# Patient Record
Sex: Female | Born: 1980
Health system: Southern US, Community
[De-identification: ages and names within clinical notes are randomized; demographics above are authoritative.]

## PROBLEM LIST (undated history)

## (undated) DIAGNOSIS — G43909 Migraine, unspecified, not intractable, without status migrainosus: Secondary | ICD-10-CM

## (undated) DIAGNOSIS — J302 Other seasonal allergic rhinitis: Secondary | ICD-10-CM

## (undated) DIAGNOSIS — N289 Disorder of kidney and ureter, unspecified: Secondary | ICD-10-CM

## (undated) DIAGNOSIS — F419 Anxiety disorder, unspecified: Secondary | ICD-10-CM

## (undated) DIAGNOSIS — D649 Anemia, unspecified: Secondary | ICD-10-CM

## (undated) HISTORY — PX: EYE SURGERY: SHX253

## (undated) HISTORY — DX: Disorder of kidney and ureter, unspecified: N28.9

## (undated) HISTORY — PX: APPENDECTOMY: SHX54

---
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
## 2000-03-29 ENCOUNTER — Emergency Department (HOSPITAL_COMMUNITY): Admission: EM | Admit: 2000-03-29 | Discharge: 2000-03-29 | Payer: Self-pay | Admitting: Emergency Medicine

## 2000-10-15 ENCOUNTER — Emergency Department (HOSPITAL_COMMUNITY): Admission: EM | Admit: 2000-10-15 | Discharge: 2000-10-15 | Payer: Self-pay | Admitting: Emergency Medicine

## 2001-04-28 ENCOUNTER — Emergency Department (HOSPITAL_COMMUNITY): Admission: EM | Admit: 2001-04-28 | Discharge: 2001-04-28 | Payer: Self-pay | Admitting: *Deleted

## 2001-04-30 ENCOUNTER — Emergency Department (HOSPITAL_COMMUNITY): Admission: EM | Admit: 2001-04-30 | Discharge: 2001-04-30 | Payer: Self-pay | Admitting: Emergency Medicine

## 2001-04-30 ENCOUNTER — Encounter: Payer: Self-pay | Admitting: Emergency Medicine

## 2002-10-26 ENCOUNTER — Encounter: Payer: Self-pay | Admitting: Emergency Medicine

## 2002-10-26 ENCOUNTER — Observation Stay (HOSPITAL_COMMUNITY): Admission: EM | Admit: 2002-10-26 | Discharge: 2002-10-27 | Payer: Self-pay | Admitting: Emergency Medicine

## 2002-10-26 ENCOUNTER — Encounter (INDEPENDENT_AMBULATORY_CARE_PROVIDER_SITE_OTHER): Payer: Self-pay | Admitting: *Deleted

## 2003-01-12 ENCOUNTER — Inpatient Hospital Stay (HOSPITAL_COMMUNITY): Admission: AD | Admit: 2003-01-12 | Discharge: 2003-01-13 | Payer: Self-pay | Admitting: Obstetrics & Gynecology

## 2003-01-13 ENCOUNTER — Encounter: Payer: Self-pay | Admitting: Obstetrics & Gynecology

## 2003-02-13 ENCOUNTER — Ambulatory Visit (HOSPITAL_COMMUNITY): Admission: RE | Admit: 2003-02-13 | Discharge: 2003-02-13 | Payer: Self-pay | Admitting: *Deleted

## 2003-05-28 ENCOUNTER — Inpatient Hospital Stay (HOSPITAL_COMMUNITY): Admission: AD | Admit: 2003-05-28 | Discharge: 2003-05-28 | Payer: Self-pay | Admitting: *Deleted

## 2003-06-06 ENCOUNTER — Inpatient Hospital Stay (HOSPITAL_COMMUNITY): Admission: AD | Admit: 2003-06-06 | Discharge: 2003-06-07 | Payer: Self-pay | Admitting: Obstetrics & Gynecology

## 2003-06-19 ENCOUNTER — Inpatient Hospital Stay (HOSPITAL_COMMUNITY): Admission: AD | Admit: 2003-06-19 | Discharge: 2003-06-19 | Payer: Self-pay | Admitting: Obstetrics & Gynecology

## 2003-06-30 ENCOUNTER — Ambulatory Visit (HOSPITAL_COMMUNITY): Admission: RE | Admit: 2003-06-30 | Discharge: 2003-06-30 | Payer: Self-pay | Admitting: *Deleted

## 2003-07-08 ENCOUNTER — Inpatient Hospital Stay (HOSPITAL_COMMUNITY): Admission: AD | Admit: 2003-07-08 | Discharge: 2003-07-10 | Payer: Self-pay | Admitting: Family Medicine

## 2005-11-20 ENCOUNTER — Emergency Department (HOSPITAL_COMMUNITY): Admission: EM | Admit: 2005-11-20 | Discharge: 2005-11-20 | Payer: Self-pay | Admitting: Emergency Medicine

## 2006-03-31 ENCOUNTER — Emergency Department (HOSPITAL_COMMUNITY): Admission: EM | Admit: 2006-03-31 | Discharge: 2006-03-31 | Payer: Self-pay | Admitting: Emergency Medicine

## 2006-04-01 ENCOUNTER — Emergency Department (HOSPITAL_COMMUNITY): Admission: EM | Admit: 2006-04-01 | Discharge: 2006-04-02 | Payer: Self-pay | Admitting: Emergency Medicine

## 2006-08-25 ENCOUNTER — Inpatient Hospital Stay (HOSPITAL_COMMUNITY): Admission: AD | Admit: 2006-08-25 | Discharge: 2006-08-25 | Payer: Self-pay | Admitting: Obstetrics and Gynecology

## 2006-09-06 ENCOUNTER — Inpatient Hospital Stay (HOSPITAL_COMMUNITY): Admission: AD | Admit: 2006-09-06 | Discharge: 2006-09-06 | Payer: Self-pay | Admitting: Obstetrics and Gynecology

## 2006-09-13 ENCOUNTER — Inpatient Hospital Stay (HOSPITAL_COMMUNITY): Admission: RE | Admit: 2006-09-13 | Discharge: 2006-09-14 | Payer: Self-pay | Admitting: Obstetrics and Gynecology

## 2007-05-31 ENCOUNTER — Ambulatory Visit: Payer: Self-pay | Admitting: Internal Medicine

## 2007-05-31 DIAGNOSIS — G43909 Migraine, unspecified, not intractable, without status migrainosus: Secondary | ICD-10-CM | POA: Insufficient documentation

## 2007-05-31 DIAGNOSIS — R519 Headache, unspecified: Secondary | ICD-10-CM | POA: Insufficient documentation

## 2007-05-31 DIAGNOSIS — R51 Headache: Secondary | ICD-10-CM | POA: Insufficient documentation

## 2007-06-27 ENCOUNTER — Ambulatory Visit: Payer: Self-pay | Admitting: Internal Medicine

## 2007-07-05 ENCOUNTER — Ambulatory Visit: Payer: Self-pay | Admitting: Internal Medicine

## 2007-08-12 ENCOUNTER — Ambulatory Visit: Payer: Self-pay | Admitting: Internal Medicine

## 2007-08-12 DIAGNOSIS — H00019 Hordeolum externum unspecified eye, unspecified eyelid: Secondary | ICD-10-CM | POA: Insufficient documentation

## 2007-09-17 ENCOUNTER — Telehealth: Payer: Self-pay | Admitting: Internal Medicine

## 2007-10-16 ENCOUNTER — Telehealth: Payer: Self-pay | Admitting: Internal Medicine

## 2007-12-31 ENCOUNTER — Ambulatory Visit: Payer: Self-pay | Admitting: Internal Medicine

## 2008-06-19 ENCOUNTER — Ambulatory Visit: Payer: Self-pay | Admitting: Internal Medicine

## 2008-06-19 DIAGNOSIS — J069 Acute upper respiratory infection, unspecified: Secondary | ICD-10-CM | POA: Insufficient documentation

## 2008-06-19 LAB — CONVERTED CEMR LAB: Rapid Strep: NEGATIVE

## 2008-07-06 ENCOUNTER — Telehealth: Payer: Self-pay | Admitting: Internal Medicine

## 2009-09-14 ENCOUNTER — Ambulatory Visit: Payer: Self-pay | Admitting: Family Medicine

## 2009-09-14 DIAGNOSIS — J029 Acute pharyngitis, unspecified: Secondary | ICD-10-CM | POA: Insufficient documentation

## 2009-09-15 ENCOUNTER — Encounter: Payer: Self-pay | Admitting: Internal Medicine

## 2009-09-15 ENCOUNTER — Ambulatory Visit: Payer: Self-pay | Admitting: Family Medicine

## 2009-09-16 ENCOUNTER — Telehealth: Payer: Self-pay | Admitting: Family Medicine

## 2010-05-31 NOTE — Letter (Signed)
Summary: Out of Work  Adult nurse at [employer]  [address]   [place], [place] [address]   Phone: [phone]  Fax: [phone]    Sep 14, 2009   Employee:  COLLIN HENDLEY Shanks    To Whom It May Concern:   For Medical reasons, please excuse the above named employee from work for the following dates:  Start:   09-14-09  End:   09-16-09  If you need additional information, please feel free to contact our office.         Sincerely,    Evelena Peat MD

## 2010-05-31 NOTE — Assessment & Plan Note (Signed)
Summary: [name]   Vital Signs:  Patient profile:   30 year old female Temp:     58. degrees F oral BP sitting:   98 / 60  (left arm) Cuff size:   regular  Vitals Entered By: Sid Falcon LPN (Sep 15, 2009 10:12 AM) CC: one day follow-up   History of Present Illness: Here for follow up.  Seen yesterday with sore throat and pos strep. ? of very early R peritonsillar abscess.  Pt is not sure of any fever overnight but still has sig pain, unimproved.  Swallowing OK.  On Augmentin 875 mg two times a day. No nausea or vomiting.   Allergies (verified): No Known Drug Allergies  Past History:  Past Medical History: Last updated: 06/19/2008 Headache, migraine  Review of Systems      See HPI  Physical Exam  General:  Well-developed,well-nourished,in no acute distress; alert,appropriate and cooperative throughout examination Head:  Normocephalic and atraumatic without obvious abnormalities. No apparent alopecia or balding. Ears:  External ear exam shows no significant lesions or deformities.  Otoscopic examination reveals clear canals, tympanic membranes are intact bilaterally without bulging, retraction, inflammation or discharge. Hearing is grossly normal bilaterally. Mouth:  R peritonsilar swelling and erythema.  No exudate. Neck:  supple with tender nodes R > L. Lungs:  Normal respiratory effort, chest expands symmetrically. Lungs are clear to auscultation, no crackles or wheezes.   Impression & Recommendations:  Problem # 1:  SORE THROAT (ICD-462) I am concerned regarding progression of edema and probable peritonsillar abscess.  pt needs to see ENT today for furhter eval.  Cont Augmentin. Her updated medication list for this problem includes:    Amoxicillin-pot Clavulanate 875-125 Mg Tabs (Amoxicillin-pot clavulanate) ..... One by mouth two times a day for 10 days  Orders: ENT Referral (ENT)  Complete Medication List: 1)  Metoprolol Succinate 25 Mg Tb24 (Metoprolol  succinate) .... Take 1 tablet by mouth two times a day 2)  Imitrex 100 Mg Tabs (Sumatriptan succinate) .... As directed 3)  Fluticasone Propionate 50 Mcg/act Susp (Fluticasone propionate) .... 2 sprays each nostril once daily 4)  Amoxicillin-pot Clavulanate 875-125 Mg Tabs (Amoxicillin-pot clavulanate) .... One by mouth two times a day for 10 days

## 2010-05-31 NOTE — Progress Notes (Signed)
Summary: vomiting  Phone Note Call from Patient   Caller: Patient Call For: Birdie Sons MD Summary of Call: Pt is calling to let Dr. Caryl Never know she has been vomiting since having the peritonsillar abscess lanced. [phone] [facility]) Initial call taken by: Lynann Beaver CMA,  Sep 16, 2009 10:06 AM  Follow-up for Phone Call        OK to call in phenergan tabs 25 mg by mouth q4-6 hours as needed nausea and vomitng #10 with no refills. Follow-up by: Evelena Peat MD,  Sep 16, 2009 12:31 PM    New/Updated Medications: PROMETHAZINE HCL 25 MG TABS (PROMETHAZINE HCL) one by mouth q 4-6 hours as needed nausea Prescriptions: PROMETHAZINE HCL 25 MG TABS (PROMETHAZINE HCL) one by mouth q 4-6 hours as needed nausea  #10 x 0   Entered by:   Lynann Beaver CMA   Authorized by:   Evelena Peat MD   Signed by:   Lynann Beaver CMA on 09/16/2009   Method used:   Electronically to        [facility] # [phone]* (retail)       [address]       [place], [place]  [address]       Ph: [phone]       Fax: [phone]   RxID:   [id number]  pt notified.

## 2010-05-31 NOTE — Assessment & Plan Note (Signed)
Summary: ST/FEVER/NJR   Vital Signs:  Patient profile:   30 year old female Temp:     98.8 degrees F oral BP sitting:   130 / 84  (left arm) Cuff size:   regular  Vitals Entered By: Sid Falcon LPN (Sep 14, 2009 11:19 AM) CC: sore throat, fever started last night   History of Present Illness: Acute visit for sore throat which started last night. Asymmetry with right side greater than left. Tylenol with minimal relief. Denies nasal congestion symptoms or cough. Still drinking and some improvement with throat spray. Temperature of 103 last night. No known drug allergies.  Allergies (verified): No Known Drug Allergies  Past History:  Past Medical History: Last updated: 06/19/2008 Headache, migraine  Past Surgical History: Last updated: 05/31/2007 Appendectomy  1999 removal of cyst on eye age 78  Review of Systems      See HPI  Physical Exam  General:  Well-developed,well-nourished,in no acute distress; alert,appropriate and cooperative throughout examination Head:  Normocephalic and atraumatic without obvious abnormalities. No apparent alopecia or balding. Ears:  External ear exam shows no significant lesions or deformities.  Otoscopic examination reveals clear canals, tympanic membranes are intact bilaterally without bulging, retraction, inflammation or discharge. Hearing is grossly normal bilaterally. Nose:  External nasal examination shows no deformity or inflammation. Nasal mucosa are pink and moist without lesions or exudates. Mouth:  patient has posterior pharynx erythema and some exudate noted right peritonsillar region. She has evidence for some very subtle soft palate swelling right side greater than left with some erythematous changes Neck:  supple with tender anterior cervical nodes right greater than left. No posterior cervical adenopathy Lungs:  Normal respiratory effort, chest expands symmetrically. Lungs are clear to auscultation, no crackles or  wheezes. Heart:  Normal rate and regular rhythm. S1 and S2 normal without gallop, murmur, click, rub or other extra sounds. Skin:  no rashes.     Impression & Recommendations:  Problem # 1:  SORE THROAT (ICD-462) Rapid strep positive. my concern is whether she may be evolving very early right peritonsillar abscess. This point difficult to say for sure. Start Augmentin 875 mg b.i.d. and reassess tomorrow. ENT referral if any signs of progression. Orders: Rapid Strep (14782)  Her updated medication list for this problem includes:    Amoxicillin-pot Clavulanate 875-125 Mg Tabs (Amoxicillin-pot clavulanate) ..... One by mouth two times a day for 10 days  Problem # 2:  MIGRAINE HEADACHE (ICD-346.90) pt requesting refills of Imitrex. Her updated medication list for this problem includes:    Metoprolol Succinate 25 Mg Tb24 (Metoprolol succinate) .Marland Kitchen... Take 1 tablet by mouth two times a day    Imitrex 100 Mg Tabs (Sumatriptan succinate) .Marland Kitchen... As directed  Complete Medication List: 1)  Metoprolol Succinate 25 Mg Tb24 (Metoprolol succinate) .... Take 1 tablet by mouth two times a day 2)  Imitrex 100 Mg Tabs (Sumatriptan succinate) .... As directed 3)  Fluticasone Propionate 50 Mcg/act Susp (Fluticasone propionate) .... 2 sprays each nostril once daily 4)  Amoxicillin-pot Clavulanate 875-125 Mg Tabs (Amoxicillin-pot clavulanate) .... One by mouth two times a day for 10 days  Patient Instructions: 1)  drink plenty of fluids. 2)  Continue Tylenol or Advil for symptomatic relief 3)  Schedule followup tomorrow with primary care physician to reassess Prescriptions: IMITREX 100 MG  TABS (SUMATRIPTAN SUCCINATE) as directed  #9 x 3   Entered and Authorized by:   Evelena Peat MD   Signed by:   Evelena Peat MD on  09/14/2009   Method used:   Electronically to        [facility] # [id number]* (retail)       [address]       [place], [place]  [address]       Ph: [phone]       Fax:  [phone]   RxID:   [id number] AMOXICILLIN-POT CLAVULANATE 875-125 MG TABS (AMOXICILLIN-POT CLAVULANATE) one by mouth two times a day for 10 days  #20 x 0   Entered and Authorized by:   Evelena Peat MD   Signed by:   Evelena Peat MD on 09/14/2009   Method used:   Electronically to        [facility] # [phone]* (retail)       [address]       [place], [place]  [address]       Ph: [phone]       Fax: [phone]   RxID:   [id number]

## 2010-05-31 NOTE — Consult Note (Signed)
Summary: [facility]  [facility]   Imported By: Maryln Gottron 09/21/2009 13:50:32  _____________________________________________________________________  External Attachment:    Type:   Image     Comment:   External Document

## 2010-09-16 NOTE — Discharge Summary (Signed)
NAME:  [place], [name]            ACCOUNT NO.:  [id number]   MEDICAL RECORD NO.:  [id number]          PATIENT TYPE:  INP   LOCATION:  9119                          FACILITY:  [facility]   PHYSICIAN:  Huel Cote, M.D. DATE OF BIRTH:  1981/02/03   DATE OF ADMISSION:  09/13/2006  DATE OF DISCHARGE:  09/14/2006                               DISCHARGE SUMMARY   DISCHARGE DIAGNOSES:  1. Term pregnancy at 39 plus weeks delivered.  2. Status post normal spontaneous vaginal delivery.   DISCHARGE MEDICATIONS:  Motrin 600 mg p.r.n. every 6 hours p.r.n.   DISCHARGE FOLLOWUP:  The patient is to follow up office in 6 weeks for  her routine postpartum exam.   HOSPITAL COURSE:  The patient is 30 year old G4, P 2-0-1-2 who was  admitted 35-3/7 weeks for induction of labor.  Prenatal care was  uncomplicated.  Prenatal labs are as follows; A+ antibody negative, RPR  nonreactive, rubella immune, hepatitis B surface antigen negative, HIV  negative, GC negative, chlamydia negative, group B strep positive, 1-  hour Glucola was 59 with a normal 3-hour Glucola. First trimester screen  was normal.   PAST OBSTETRICAL HISTORY:  She had a history of a vaginal delivery of a  66-year-old and a vaginal delivery of a 58-year-old. She had one elective  abortion and no other pregnancies.   PAST MEDICAL HISTORY:  None.   PAST SURGICAL HISTORY:  None.   ALLERGIES:  No known drug allergies.   SOCIAL HISTORY:  She is a smoker approximately a pack a day.   HOSPITAL COURSE:  On admission she was afebrile with stable vital signs.  Fetal heart rate was reactive.  She was placed on Pitocin, her cervical  exam thereafter was 4-5, 90, a 0 station which point she received an  epidural. She was on penicillin for her positive group B strep status.  She progressed rapidly to complete dilation and pushed well, delivered a  vigorous female infant, Apgars were 9 and 9, weight was 7 pounds 5 ounces.  She had left labial right  periurethral lacerations which were repaired  with 3-0 Vicryl for hemostasis. She then was admitted for routine  postpartum care.  Postpartum day #1 she was doing well.  Her hemoglobin  was 10.4.  She was afebrile with stable vital signs and requested early  discharge that evening which was granted. She will follow up in the  office as stated in 6 weeks.     Huel Cote, M.D.  Electronically Signed    KR/MEDQ  D:  10/29/2006  T:  10/29/2006  Job:  [id number]

## 2010-09-16 NOTE — H&P (Signed)
   NAMECOLLEENA, [name] NO.:  [id number]   MEDICAL RECORD NO.:  [id number]                   PATIENT TYPE:  EMS   LOCATION:  MAJO                                 FACILITY:  [facility]   PHYSICIAN:  Gabrielle Dare. Janee Morn, M.D.             DATE OF BIRTH:  04-Jul-1980   DATE OF ADMISSION:  10/26/2002  DATE OF DISCHARGE:                                HISTORY & PHYSICAL   CHIEF COMPLAINT:  Right lower quadrant abdominal pain.   HISTORY OF PRESENT ILLNESS:  The patient is a 30 year old white female who old white female who  developed right lower quadrant abdominal pain late Friday night or early  Saturday morning.  This persisted and got worse and she came to the  emergency room for evaluation.  Work up revealed normal labs but CT scan is  consistent with early acute appendicitis.  She continues to complain of this  pain in her right lower quadrant.  She has not been vomiting but has had  some mild nausea.  No other GI complaints.   PAST MEDICAL HISTORY:  Ovarian cyst.   PAST SURGICAL HISTORY:  None.   MEDICATIONS:  None.   ALLERGIES:  No known drug allergies.   SOCIAL HISTORY:  She does not smoke.  She drinks alcohol occasionally.   REVIEW OF SYSTEMS:  CARDIOVASCULAR: Negative.  PULMONARY: Negative.  GI:  Please refer to the history of present illness.  MUSCULOSKELETAL: Negative.  NEURO/PSYCHE: Negative.  Remainder of review of systems is negative.   PHYSICAL EXAMINATION:  VITAL SIGNS: Temperature is 97.5, blood pressure  127/87, pulse 98, respirations 21.  GENERAL: She is awake, alert, in some mild distress.  HEENT: Pupils are equal, sclerae is non icteric.  NECK: Supple.  LUNGS: Clear to auscultation bilaterally.  HEART: Regular rate and rhythm.  ABDOMEN: Soft, she has point tenderness with voluntary guarding in the right  lower quadrant.  The remainder of the abdomen is soft and nontender with no  masses.  SKIN: Warm and dry with no rashes.   DATA REVIEWED:  Includes a  white blood cell count of 8400.  Urine pregnancy  screen was negative.  CT scan is consistent with early appendicitis.   IMPRESSION:  30 year old healthy female with acute appendicitis with acute appendicitis.    PLAN:  Will take the patient to the operating room for appendectomy.  The  procedure risks and benefits were discussed in detail with the patient and  her mother.  Questions were answered and we will plan to go ahead with the  procedure.                                               Gabrielle Dare Janee Morn, M.D.    BET/MEDQ  D:  10/26/2002  T:  10/26/2002  Job:  [id number]

## 2010-09-16 NOTE — Op Note (Signed)
[name], [name]                      ACCOUNT NO.:  [id number]   MEDICAL RECORD NO.:  [id number]                   PATIENT TYPE:  OBV   LOCATION:  2550                                 FACILITY:  [facility]   PHYSICIAN:  Gabrielle Dare. Janee Morn, M.D.             DATE OF BIRTH:  06-13-1980   DATE OF PROCEDURE:  10/26/2002  DATE OF DISCHARGE:                                 OPERATIVE REPORT   PREOPERATIVE DIAGNOSIS:  Acute appendicitis.   POSTOPERATIVE DIAGNOSES:  1. Acute appendicitis.  2. Inflamed right ovary.   PROCEDURE:  Laparoscopic appendectomy.   SURGEON:  Gabrielle Dare. Janee Morn, M.D.   ANESTHESIA:  General.   HISTORY OF PRESENT ILLNESS:  The patient is a 30 year old female who came to  the emergency department with an over 24-hour complaint of increasing right  lower quadrant pain. Her white blood cell count was normal but her CT scan  demonstrated an early acute appendicitis. She was brought to the operating  room for an appendectomy.   DESCRIPTION OF PROCEDURE:  Informed consent had been obtained. The patient  received intravenous antibiotics. She was brought to the operating room and  underwent general anesthesia. Her abdomen  was prepped and draped in a  sterile fashion.   A curvilinear infraumbilical incision was made. The subcutaneous tissues  were dissected down, the anterior fascia was divided sharply and the  peritoneum was then entered under direct vision without difficulty. Then a 0  Vicryl pursestring suture was placed around the fascial opening and a Hasson  trocar was then inserted into the abdomen, and the abdomen was insufflated  with CO2 in the standard fashion. Under direct vision a 5-mm right upper  quadrant port and a 12-mm left lower abdomen port were placed.   Subsequently exploration of the right lower quadrant demonstrated a mildly  inflamed appendix with some injection and laying on what appeared to be a  somewhat inflamed right ovary. There was a  significant amount of fluid in  the area. Subsequently the appendix was retracted, and a window was  dissected in the mesoappendix next  to the base.   The mesoappendix was then divided with a vascular load of the endoscopic GIA  stapler. This freed it up nicely. Subsequently the base of the appendix was  divided with the tissue  load of the endoscopic GIA stapler. The appendix  was placed in an EndoCatch bag and taken out of the abdomen via the left  lower quadrant port site.   Once this was accomplished the abdomen was copiously irrigated. No other  abnormalities were noted. The abdomen was copiously irrigated with 2 liters  of saline. Excellent hemostasis was noted. The 2 ports were removed under  direct vision. The pneumoperitoneum was released and the Hasson trocar was  removed.   The umbilical fascia was closed by tying the 0 Vicryl pursestring suture.  Subsequently all 3 wounds were copiously irrigated and hemostasis was  obtained with the Bovie cautery and the skin of each was closed with a 4-0  Vicryl subcuticular stitch. All sponge, instrument and needle counts were  all correct. Benzoin, Steri-Strips and a sterile dressings were applied.   The patient tolerated the procedure without apparent complications. She was  taken to the recovery room in stable condition.                                               Gabrielle Dare Janee Morn, M.D.    BET/MEDQ  D:  10/26/2002  T:  10/26/2002  Job:  [id number]

## 2010-09-16 NOTE — Discharge Summary (Signed)
   NAMEKAZZANDRA, [name]                      ACCOUNT NO.:  [id number]   MEDICAL RECORD NO.:  [id number]                   PATIENT TYPE:  OBV   LOCATION:  5733                                 FACILITY:  [facility]   PHYSICIAN:  Gabrielle Dare. Janee Morn, M.D.             DATE OF BIRTH:  1980-11-24   DATE OF ADMISSION:  10/26/2002  DATE OF DISCHARGE:  10/27/2002                                 DISCHARGE SUMMARY   DISCHARGE DIAGNOSES:  1. Acute appendicitis.  2. Status post laparoscopic appendectomy.   HISTORY OF PRESENT ILLNESS:  The patient is a healthy 30 year old white  female who presented with acute onset of right lower quadrant abdominal pain  to the emergency room yesterday.  CT scan demonstrated early acute  appendicitis.  She was admitted and taken to the operating room for  emergency appendectomy.   HOSPITAL COURSE:  The patient underwent a laparoscopic appendectomy which  was uncomplicated.  Intraoperatively, she was also noted to have some  inflammation of her right ovary, which was down in the same area next to her  appendix.  No other abnormalities were noted.  She tolerated the procedure  well postoperatively.  She remained hemodynamically stable and afebrile.  She tolerated gradual advancement of her diet, had good pain control and was  discharged home on postoperative day one in stable condition.   DISCHARGE DIET:  Regular.   DISCHARGE ACTIVITIES:  No lifting.   DISCHARGE MEDICATIONS:  Vicodin 5/500 one to two every 6 hours p.r.n. pain  and follow up with me, Dr. Violeta Gelinas, in three weeks.                                               Gabrielle Dare Janee Morn, M.D.    BET/MEDQ  D:  10/27/2002  T:  10/27/2002  Job:  [id number]

## 2010-11-10 ENCOUNTER — Telehealth: Payer: Self-pay | Admitting: Internal Medicine

## 2010-11-10 MED ORDER — SUMATRIPTAN SUCCINATE 100 MG PO TABS: 100.0000 mg | ORAL_TABLET | Freq: Once | ORAL | Status: DC | PRN

## 2010-11-10 NOTE — Telephone Encounter (Signed)
rx called in

## 2010-11-10 NOTE — Telephone Encounter (Signed)
Pt called 7/12 ands needs a refill on her Imitrex. She used to get it at [facility], but due to an insurance change, she now has to go to [facility] on [address]. She has not yet used them. She was told by a scheduler here that she had to call her pharmacy. Because she has not yet had a Rx filled at [facility], they told her to call us. Please call this Imitrex in as a new Rx to the [facility]. Their ph # is [phone].

## 2011-12-22 ENCOUNTER — Ambulatory Visit (INDEPENDENT_AMBULATORY_CARE_PROVIDER_SITE_OTHER): Payer: Managed Care, Other (non HMO) | Admitting: Family Medicine

## 2011-12-22 ENCOUNTER — Other Ambulatory Visit: Payer: Self-pay | Admitting: Internal Medicine

## 2011-12-22 ENCOUNTER — Encounter: Payer: Self-pay | Admitting: Family Medicine

## 2011-12-22 VITALS — BP 110/80 | Temp 98.9°F | Wt 167.0 lb

## 2011-12-22 DIAGNOSIS — G43909 Migraine, unspecified, not intractable, without status migrainosus: Secondary | ICD-10-CM

## 2011-12-22 DIAGNOSIS — G43901 Migraine, unspecified, not intractable, with status migrainosus: Secondary | ICD-10-CM

## 2011-12-22 DIAGNOSIS — G43809 Other migraine, not intractable, without status migrainosus: Secondary | ICD-10-CM

## 2011-12-22 MED ORDER — SUMATRIPTAN SUCCINATE 100 MG PO TABS: 100.0000 mg | ORAL_TABLET | Freq: Once | ORAL | Status: DC | PRN

## 2011-12-22 MED ORDER — HYDROCODONE-ACETAMINOPHEN 5-325 MG PO TABS: ORAL_TABLET | ORAL | Status: DC

## 2011-12-22 MED ORDER — PREDNISONE 10 MG PO TABS: ORAL_TABLET | ORAL | Status: DC

## 2011-12-22 MED ORDER — PROMETHAZINE HCL 25 MG PO TABS: 25.0000 mg | ORAL_TABLET | Freq: Three times a day (TID) | ORAL | Status: DC | PRN

## 2011-12-22 MED ORDER — TOPIRAMATE 25 MG PO TABS: ORAL_TABLET | ORAL | Status: DC

## 2011-12-22 NOTE — Progress Notes (Signed)
  Subjective:    Patient ID: [name], female    DOB: [birth year], 31 y.o.   MRN: [id number]  HPI  Acute visit. Patient seen with prolonged headache for 5 days. Typical migraines in terms of quality but atypical duration. She's had a tendency to have migraines lasting about 3 days previously. Her headache is mostly right frontal to parietal. She's had some nausea and occasional vomiting. Photophobia. Difficulty sleeping past few days. She usually takes Imitrex but ran out. Denies fever. No focal neurologic symptoms. Took aspirin and Excedrin Migraine without any improvement. No alleviating factors.  No associated fever, nasal discharge.     Review of Systems  Constitutional: Negative for fever and chills.  HENT: Negative for neck stiffness.   Gastrointestinal: Positive for nausea and vomiting.  Neurological: Positive for headaches. Negative for dizziness, syncope and weakness.  Hematological: Negative for adenopathy.  Psychiatric/Behavioral: Negative for confusion.       Objective:   Physical Exam  Constitutional: She is oriented to person, place, and time. She appears well-developed and well-nourished.  HENT:  Right Ear: External ear normal.  Left Ear: External ear normal.  Eyes: Pupils are equal, round, and reactive to light.  Neck: Neck supple.  Cardiovascular: Normal rate and regular rhythm.   Pulmonary/Chest: Effort normal and breath sounds normal. No respiratory distress. She has no wheezes. She has no rales.  Neurological: She is alert and oriented to person, place, and time. No cranial nerve deficit.  Psychiatric: She has a normal mood and affect. Her behavior is normal.          Assessment & Plan:  Status migraine. Prednisone taper. Limited hydrocodone for severe pain. Phenergan for nausea. May need admission for IV medications if not resolving with above.  Discussed possible prophylaxis. Previously tried metoprolol without success. Start low-dose Topamax and  titrate and followup with primary in one month

## 2011-12-22 NOTE — Patient Instructions (Signed)

## 2012-01-22 ENCOUNTER — Ambulatory Visit (INDEPENDENT_AMBULATORY_CARE_PROVIDER_SITE_OTHER): Payer: Managed Care, Other (non HMO) | Admitting: Family Medicine

## 2012-01-22 DIAGNOSIS — G43909 Migraine, unspecified, not intractable, without status migrainosus: Secondary | ICD-10-CM

## 2012-01-22 MED ORDER — TOPIRAMATE 25 MG PO TABS: ORAL_TABLET | ORAL | Status: DC

## 2012-01-22 NOTE — Patient Instructions (Addendum)

## 2012-01-23 NOTE — Progress Notes (Signed)
  Subjective:    Patient ID: [name], female    DOB: 08/05/1980, 31 y.o.   MRN: [id number]  HPI  Followup for headaches. Suspected migraines. Had recent status migraine type headache which eventually improved with prednisone. She's had increased frequency and severity of headaches over the past year. Previous intolerance to beta blockers. Patient on birth control. After much discussion, we decided on trial of Topamax. She's had great response. She has had 3 milder migraines since then but overall decreasing frequency and severity. No change in pattern headaches otherwise. She is overall very pleased with response to Topamax. No focal neurologic symptoms   Review of Systems  Constitutional: Negative for fever, appetite change, fatigue and unexpected weight change.  Eyes: Negative for visual disturbance.  Neurological: Positive for headaches. Negative for dizziness, seizures and syncope.  Psychiatric/Behavioral: Negative for confusion.       Objective:   Physical Exam  Constitutional: She is oriented to person, place, and time. She appears well-developed and well-nourished. No distress.  Eyes: Pupils are equal, round, and reactive to light.       Fundi benign  Neck: Neck supple. No thyromegaly present.  Cardiovascular: Normal rate and regular rhythm.   Pulmonary/Chest: Effort normal and breath sounds normal. No respiratory distress. She has no wheezes. She has no rales.  Lymphadenopathy:    She has no cervical adenopathy.  Neurological: She is alert and oriented to person, place, and time. No cranial nerve deficit.          Assessment & Plan:  Migraine headaches. Improving on Topamax. Currently takes 25 mg 2 tablets daily. Titrate to 25 mg in the morning and 50 mg at night. After 2 weeks if no further improvements titrate further to 50 mg twice daily.  She knows that she must maintain regular birth control on this medication

## 2012-02-09 ENCOUNTER — Ambulatory Visit: Payer: Managed Care, Other (non HMO) | Admitting: Internal Medicine

## 2012-03-23 ENCOUNTER — Encounter (HOSPITAL_COMMUNITY): Payer: Self-pay | Admitting: Emergency Medicine

## 2012-03-23 ENCOUNTER — Emergency Department (HOSPITAL_COMMUNITY)
Admission: EM | Admit: 2012-03-23 | Discharge: 2012-03-23 | Disposition: A | Discharge status: Home / Self Care | Payer: Managed Care, Other (non HMO) | Attending: Emergency Medicine | Admitting: Emergency Medicine

## 2012-03-23 DIAGNOSIS — Z9889 Other specified postprocedural states: Secondary | ICD-10-CM

## 2012-03-23 DIAGNOSIS — J029 Acute pharyngitis, unspecified: Secondary | ICD-10-CM

## 2012-03-23 DIAGNOSIS — Z862 Personal history of diseases of the blood and blood-forming organs and certain disorders involving the immune mechanism: Secondary | ICD-10-CM | POA: Insufficient documentation

## 2012-03-23 DIAGNOSIS — R509 Fever, unspecified: Secondary | ICD-10-CM | POA: Insufficient documentation

## 2012-03-23 DIAGNOSIS — Z8639 Personal history of other endocrine, nutritional and metabolic disease: Secondary | ICD-10-CM | POA: Insufficient documentation

## 2012-03-23 DIAGNOSIS — Z8669 Personal history of other diseases of the nervous system and sense organs: Secondary | ICD-10-CM | POA: Insufficient documentation

## 2012-03-23 DIAGNOSIS — F17200 Nicotine dependence, unspecified, uncomplicated: Secondary | ICD-10-CM | POA: Insufficient documentation

## 2012-03-23 DIAGNOSIS — G43109 Migraine with aura, not intractable, without status migrainosus: Secondary | ICD-10-CM | POA: Insufficient documentation

## 2012-03-23 DIAGNOSIS — H60399 Other infective otitis externa, unspecified ear: Secondary | ICD-10-CM | POA: Insufficient documentation

## 2012-03-23 HISTORY — DX: Migraine, unspecified, not intractable, without status migrainosus: G43.909

## 2012-03-23 MED ORDER — CLINDAMYCIN HCL 300 MG PO CAPS: 300.0000 mg | ORAL_CAPSULE | Freq: Four times a day (QID) | ORAL | Status: DC

## 2012-03-23 MED ORDER — PREDNISONE 10 MG PO TABS: 20.0000 mg | ORAL_TABLET | Freq: Two times a day (BID) | ORAL | Status: DC

## 2012-03-23 NOTE — ED Notes (Signed)
Pt sts hx of tonsillar abscess and sts feels same on right side with pain and fever; pt sts taking antibiotics x 3 days for ear infection

## 2012-03-23 NOTE — ED Provider Notes (Signed)
History   This chart was scribed for Geoffery Lyons, MD, by Frederik Pear, ER scribe. The patient was seen in room TR09C/TR09C and the patient's care was started at 1308.    CSN: [id number]  Arrival date & time 03/23/12  1258   First MD Initiated Contact with Patient 03/23/12 1308      Chief Complaint  Patient presents with  . Abscess  . Sore Throat    (Consider location/radiation/quality/duration/timing/severity/associated sxs/prior treatment) HPI Comments: [name] is a 31 y.o. female with a h/o of tonsillar abscesses who presents to the Emergency Department complaining of a right-sided tonsillar pain that began at 2 am. She reports a fever at home last night that she has been treating with Excedrin that provided moderate relief. In ED, her temperature is 99. She also reports a left-sided ear infection that she has been taking doxycyline for 3 days. She reports that her son has been ill with a cold this week.          Past Medical History  Diagnosis Date  . Migraine     History reviewed. No pertinent past surgical history.  History reviewed. No pertinent family history.  History  Substance Use Topics  . Smoking status: Current Every Day Smoker  . Smokeless tobacco: Not on file  . Alcohol Use: Yes     Comment: occ    OB History    Grav Para Term Preterm Abortions TAB SAB Ect Mult Living                  Review of Systems A complete 10 system review of systems was obtained and all systems are negative except as noted in the HPI and PMH.   Allergies  Review of patient's allergies indicates no known allergies.  Home Medications   Current Outpatient Rx  Name  Route  Sig  Dispense  Refill  . HYDROCODONE-ACETAMINOPHEN 5-325 MG PO TABS      One to 2 tablets every 4-6 hours as needed for severe headache   20 tablet   0   . MICROGESTIN 1/20 1-20 MG-MCG PO TABS   Oral   Take 1 tablet by mouth daily. Per OB/GYN         . PREDNISONE 10 MG PO TABS     Taper as follows: 4-4-4-3-3-2-1   21 tablet   0   . PROMETHAZINE HCL 25 MG PO TABS   Oral   Take 1 tablet (25 mg total) by mouth every 8 (eight) hours as needed for nausea.   15 tablet   0   . SUMATRIPTAN SUCCINATE 100 MG PO TABS   Oral   Take 1 tablet (100 mg total) by mouth once as needed for migraine.   9 tablet   6   . TOPIRAMATE 25 MG PO TABS      Take one tablet each morning and two tablets at night.   90 tablet   6     BP 126/80  Pulse 108  Temp 99 F (37.2 C) (Oral)  Resp 16  SpO2 100%  Physical Exam  Nursing note and vitals reviewed. Constitutional: She is oriented to person, place, and time. She appears well-developed and well-nourished. No distress.  HENT:  Head: Normocephalic and atraumatic.       There is mild erythema to her posterior oropharynx.  There are no exudates. Her tonsils appear symmetrical.   Eyes: EOM are normal. Pupils are equal, round, and reactive to light.  Neck: Normal  range of motion. Neck supple. No tracheal deviation present.  Cardiovascular: Normal rate.   Pulmonary/Chest: Effort normal. No respiratory distress.  Abdominal: Soft. She exhibits no distension.  Musculoskeletal: Normal range of motion. She exhibits no edema.  Neurological: She is alert and oriented to person, place, and time.  Skin: Skin is warm and dry.  Psychiatric: She has a normal mood and affect. Her behavior is normal.    ED Course  Procedures (including critical care time)  DIAGNOSTIC STUDIES: Oxygen Saturation is 100% on room air, normal by my interpretation.    COORDINATION OF CARE:  13:20- Discussed planned course of treatment with the patient, including a prescription of prednisone and clindamycin,  who is agreeable at this time.    Labs Reviewed - No data to display No results found.   No diagnosis found.    MDM  No evidence of a peritonsillar abscess on exam.  She is afebrile and there is no stridor.  Will treat with clindamycin and  prednisone.  She is to return if worsening.       I personally performed the services described in this documentation, which was scribed in my presence. The recorded information has been reviewed and is accurate.         Geoffery Lyons, MD 03/23/12 [phone]

## 2012-05-01 NOTE — L&D Delivery Note (Signed)
Delivery Note At 10:10 PM a healthy female was delivered via Vaginal, Spontaneous Delivery (Presentation: Left Occiput Anterior).  APGAR: 9, 9; weight pending.   Placenta status: Intact, Spontaneous.  Cord:  with the following complications: None.    Anesthesia: Epidural  Episiotomy: None Lacerations: small periurethral Suture Repair: 3.0 vicryl rapide Est. Blood Loss (mL): 350cc  Mom to postpartum.  Baby to stay with mom  [name] 12/19/2012, 10:30 PM

## 2012-08-08 ENCOUNTER — Other Ambulatory Visit (HOSPITAL_COMMUNITY): Payer: Self-pay | Admitting: Obstetrics and Gynecology

## 2012-08-08 DIAGNOSIS — Z3689 Encounter for other specified antenatal screening: Secondary | ICD-10-CM

## 2012-08-12 ENCOUNTER — Encounter (HOSPITAL_COMMUNITY): Payer: Self-pay

## 2012-08-12 ENCOUNTER — Ambulatory Visit (HOSPITAL_COMMUNITY)
Admission: RE | Admit: 2012-08-12 | Discharge: 2012-08-12 | Disposition: A | Discharge status: Home / Self Care | Payer: Managed Care, Other (non HMO) | Source: Ambulatory Visit | Attending: Obstetrics and Gynecology | Admitting: Obstetrics and Gynecology

## 2012-08-12 DIAGNOSIS — O358XX0 Maternal care for other (suspected) fetal abnormality and damage, not applicable or unspecified: Secondary | ICD-10-CM | POA: Insufficient documentation

## 2012-08-12 DIAGNOSIS — Z1389 Encounter for screening for other disorder: Secondary | ICD-10-CM | POA: Insufficient documentation

## 2012-08-12 DIAGNOSIS — Z3689 Encounter for other specified antenatal screening: Secondary | ICD-10-CM

## 2012-08-12 DIAGNOSIS — Z363 Encounter for antenatal screening for malformations: Secondary | ICD-10-CM | POA: Insufficient documentation

## 2012-08-19 LAB — OB RESULTS CONSOLE ABO/RH: RH Type: POSITIVE

## 2012-08-19 LAB — OB RESULTS CONSOLE GC/CHLAMYDIA
Chlamydia: NEGATIVE
Gonorrhea: NEGATIVE

## 2012-12-19 ENCOUNTER — Encounter (HOSPITAL_COMMUNITY): Payer: Self-pay | Admitting: *Deleted

## 2012-12-19 ENCOUNTER — Inpatient Hospital Stay (HOSPITAL_COMMUNITY)
Admission: AD | Admit: 2012-12-19 | Discharge: 2012-12-21 | DRG: 775 | Disposition: A | Discharge status: Home / Self Care | Payer: Managed Care, Other (non HMO) | Source: Ambulatory Visit | Attending: Obstetrics and Gynecology | Admitting: Obstetrics and Gynecology

## 2012-12-19 ENCOUNTER — Inpatient Hospital Stay (HOSPITAL_COMMUNITY): Payer: Managed Care, Other (non HMO) | Admitting: Anesthesiology

## 2012-12-19 ENCOUNTER — Encounter (HOSPITAL_COMMUNITY): Payer: Self-pay | Admitting: Anesthesiology

## 2012-12-19 DIAGNOSIS — O99334 Smoking (tobacco) complicating childbirth: Secondary | ICD-10-CM | POA: Diagnosis present

## 2012-12-19 LAB — CBC
HCT: 33.4 % — ABNORMAL LOW (ref 36.0–46.0)
Hemoglobin: 11.9 g/dL — ABNORMAL LOW (ref 12.0–15.0)
MCH: 30.1 pg (ref 26.0–34.0)
MCV: 84.3 fL (ref 78.0–100.0)
Platelets: 175 10*3/uL (ref 150–400)
RBC: 3.96 MIL/uL (ref 3.87–5.11)
WBC: 9.8 10*3/uL (ref 4.0–10.5)

## 2012-12-19 MED ORDER — PHENYLEPHRINE 40 MCG/ML (10ML) SYRINGE FOR IV PUSH (FOR BLOOD PRESSURE SUPPORT)
80.0000 ug | PREFILLED_SYRINGE | INTRAVENOUS | Status: DC | PRN
  Filled 2012-12-19: qty 5
  Filled 2012-12-19: qty 2

## 2012-12-19 MED ORDER — CITRIC ACID-SODIUM CITRATE 334-500 MG/5ML PO SOLN
30.0000 mL | ORAL | Status: DC | PRN
  Administered 2012-12-19: 30 mL via ORAL
  Filled 2012-12-19: qty 15

## 2012-12-19 MED ORDER — SODIUM BICARBONATE 8.4 % IV SOLN
INTRAVENOUS | Status: DC | PRN
  Administered 2012-12-19: 5 mL via EPIDURAL

## 2012-12-19 MED ORDER — OXYTOCIN BOLUS FROM INFUSION: 500.0000 mL | INTRAVENOUS | Status: DC

## 2012-12-19 MED ORDER — PHENYLEPHRINE 40 MCG/ML (10ML) SYRINGE FOR IV PUSH (FOR BLOOD PRESSURE SUPPORT)
80.0000 ug | PREFILLED_SYRINGE | INTRAVENOUS | Status: DC | PRN
Start: 2012-12-19 — End: 2012-12-20
  Filled 2012-12-19: qty 2

## 2012-12-19 MED ORDER — TERBUTALINE SULFATE 1 MG/ML IJ SOLN: 0.2500 mg | Freq: Once | INTRAMUSCULAR | Status: AC | PRN

## 2012-12-19 MED ORDER — LIDOCAINE HCL (PF) 1 % IJ SOLN
30.0000 mL | INTRAMUSCULAR | Status: DC | PRN
  Filled 2012-12-19 (×2): qty 30

## 2012-12-19 MED ORDER — BUTORPHANOL TARTRATE 1 MG/ML IJ SOLN: 1.0000 mg | INTRAMUSCULAR | Status: DC | PRN

## 2012-12-19 MED ORDER — FENTANYL 2.5 MCG/ML BUPIVACAINE 1/10 % EPIDURAL INFUSION (WH - ANES)
14.0000 mL/h | INTRAMUSCULAR | Status: DC | PRN
  Administered 2012-12-19: 14 mL/h via EPIDURAL
  Filled 2012-12-19: qty 125

## 2012-12-19 MED ORDER — LACTATED RINGERS IV SOLN: 500.0000 mL | INTRAVENOUS | Status: DC | PRN

## 2012-12-19 MED ORDER — LACTATED RINGERS IV SOLN
500.0000 mL | Freq: Once | INTRAVENOUS | Status: AC
  Administered 2012-12-19: 500 mL via INTRAVENOUS

## 2012-12-19 MED ORDER — LACTATED RINGERS IV SOLN: INTRAVENOUS | Status: DC

## 2012-12-19 MED ORDER — OXYCODONE-ACETAMINOPHEN 5-325 MG PO TABS
1.0000 | ORAL_TABLET | ORAL | Status: DC | PRN
Start: 2012-12-19 — End: 2012-12-20

## 2012-12-19 MED ORDER — EPHEDRINE 5 MG/ML INJ
10.0000 mg | INTRAVENOUS | Status: DC | PRN
  Filled 2012-12-19: qty 2

## 2012-12-19 MED ORDER — ONDANSETRON HCL 4 MG/2ML IJ SOLN: 4.0000 mg | Freq: Four times a day (QID) | INTRAMUSCULAR | Status: DC | PRN

## 2012-12-19 MED ORDER — EPHEDRINE 5 MG/ML INJ
10.0000 mg | INTRAVENOUS | Status: DC | PRN
Start: 2012-12-19 — End: 2012-12-20
  Filled 2012-12-19: qty 4
  Filled 2012-12-19: qty 2

## 2012-12-19 MED ORDER — DIPHENHYDRAMINE HCL 50 MG/ML IJ SOLN: 12.5000 mg | INTRAMUSCULAR | Status: DC | PRN

## 2012-12-19 MED ORDER — OXYTOCIN 40 UNITS IN LACTATED RINGERS INFUSION - SIMPLE MED
1.0000 m[IU]/min | INTRAVENOUS | Status: DC
  Administered 2012-12-19: 2 m[IU]/min via INTRAVENOUS

## 2012-12-19 MED ORDER — OXYTOCIN 40 UNITS IN LACTATED RINGERS INFUSION - SIMPLE MED
62.5000 mL/h | INTRAVENOUS | Status: DC
  Administered 2012-12-19: 62.5 mL/h via INTRAVENOUS
  Filled 2012-12-19: qty 1000

## 2012-12-19 MED ORDER — ACETAMINOPHEN 325 MG PO TABS: 650.0000 mg | ORAL_TABLET | ORAL | Status: DC | PRN

## 2012-12-19 MED ORDER — IBUPROFEN 600 MG PO TABS: 600.0000 mg | ORAL_TABLET | Freq: Four times a day (QID) | ORAL | Status: DC | PRN

## 2012-12-19 NOTE — H&P (Signed)
[name] is a 32 y.o. female G5P3013 at 37+ weeks (EDD 01/04/13 by LMP c/w 9 week [place]) presenting for ROM with slow steady LOF since 630 last pm.  Irregular contractions.  ROM confirmed with a pool and +nitrazine test in office.  Prenatal care uncomplicated  Maternal Medical History:  Reason for admission: Rupture of membranes.   Contractions: Onset was 6-12 hours ago.   Frequency: irregular.   Perceived severity is mild.    Fetal activity: Perceived fetal activity is normal.    Prenatal Complications - Diabetes: none.    Past Ob Hx 1998 SAB 2000 NSVD 5#15oz 2005 NSVD 6#8oz 2008 NSVD 735oz  Past Medical History  Diagnosis Date  . Migraine    No past surgical history on file. Family History: family history is not on file. Social History:  reports that she has been smoking.  She does not have any smokeless tobacco history on file. She reports that  drinks alcohol. She reports that she does not use illicit drugs.   Prenatal Transfer Tool  Maternal Diabetes: No Genetic Screening: Normal Maternal Ultrasounds/Referrals: Normal Fetal Ultrasounds or other Referrals:  None Maternal Substance Abuse:  No Significant Maternal Medications:  None Significant Maternal Lab Results:  None Other Comments:  None  ROS  Dilation: 2.5 Effacement (%): 50 Station: -2 Exam by:: Elize Pinon Clear fluid, no forebag felt  Blood pressure 119/59, pulse 110, temperature 98.5 F (36.9 C), resp. rate 18, height 5\' 4"  (1.626 m), weight 79.833 kg (176 lb). Maternal Exam:  Uterine Assessment: Contraction strength is mild.  Contraction frequency is irregular.   Abdomen: Patient reports no abdominal tenderness. Fetal presentation: vertex  Introitus: Normal vulva. Normal vagina.  Nitrazine test: positive. Amniotic fluid character: clear.  Pelvis: adequate for delivery.      Physical Exam  Constitutional: She is oriented to person, place, and time. She appears well-developed and  well-nourished.  Cardiovascular: Normal rate and regular rhythm.   Respiratory: Effort normal and breath sounds normal.  GI: Soft.  Genitourinary: Vagina normal and uterus normal.  Neurological: She is alert and oriented to person, place, and time.  Psychiatric: She has a normal mood and affect.    Prenatal labs: ABO, Rh:  A positive Antibody:  negative Rubella:  Immune RPR:   NR HBsAg:   Neg HIV:   NR GBS:   Neg One hour GTT 123 GBS negative  GC negative Chlam negative  Assessment/Plan: Pt admitted for SROM and augmentation of labor.  GBS negative.  Epidural prn.   Oliver Pila 12/19/2012, 2:06 PM

## 2012-12-19 NOTE — Progress Notes (Signed)
Patient ID: [name], female   DOB: 30-Sep-1980, 31 y.o.   MRN: [id number] Pt received epidural and feels better FHR reassuring 90/4-5/-1 per RN Doing well will follow progress.

## 2012-12-19 NOTE — Anesthesia Procedure Notes (Signed)

## 2012-12-19 NOTE — Anesthesia Preprocedure Evaluation (Signed)

## 2012-12-19 NOTE — Progress Notes (Signed)
   Subjective: Pt just now getting uncomfortable  Objective: BP 117/80  Pulse 82  Temp(Src) 98.3 F (36.8 C)  Resp 16  Ht 5\' 4"  (1.626 m)  Wt 79.833 kg (176 lb)  BMI 30.2 kg/m2      FHT: Category 1 UC:  q 3-4 SVE:   3/60/-1 Labs: Lab Results  Component Value Date   WBC 9.8 12/19/2012   HGB 11.9* 12/19/2012   HCT 33.4* 12/19/2012   MCV 84.3 12/19/2012   PLT 175 12/19/2012    Assessment / Plan: Continue pitocin Lianette Broussard W 12/19/2012, 7:00 PM

## 2012-12-20 LAB — CBC
Hemoglobin: 10.7 g/dL — ABNORMAL LOW (ref 12.0–15.0)
MCH: 30 pg (ref 26.0–34.0)
Platelets: 188 10*3/uL (ref 150–400)
RBC: 3.57 MIL/uL — ABNORMAL LOW (ref 3.87–5.11)
WBC: 12.2 10*3/uL — ABNORMAL HIGH (ref 4.0–10.5)

## 2012-12-20 MED ORDER — BENZOCAINE-MENTHOL 20-0.5 % EX AERO
1.0000 "application " | INHALATION_SPRAY | CUTANEOUS | Status: DC | PRN
  Administered 2012-12-20: 1 via TOPICAL
  Filled 2012-12-20: qty 56

## 2012-12-20 MED ORDER — PRENATAL MULTIVITAMIN CH
1.0000 | ORAL_TABLET | Freq: Every day | ORAL | Status: DC
  Administered 2012-12-20: 1 via ORAL
  Filled 2012-12-20: qty 1

## 2012-12-20 MED ORDER — SENNOSIDES-DOCUSATE SODIUM 8.6-50 MG PO TABS: 2.0000 | ORAL_TABLET | Freq: Every day | ORAL | Status: DC

## 2012-12-20 MED ORDER — SIMETHICONE 80 MG PO CHEW: 80.0000 mg | CHEWABLE_TABLET | ORAL | Status: DC | PRN

## 2012-12-20 MED ORDER — ONDANSETRON HCL 4 MG PO TABS: 4.0000 mg | ORAL_TABLET | ORAL | Status: DC | PRN

## 2012-12-20 MED ORDER — PNEUMOCOCCAL VAC POLYVALENT 25 MCG/0.5ML IJ INJ
0.5000 mL | INJECTION | INTRAMUSCULAR | Status: AC
  Administered 2012-12-21: 0.5 mL via INTRAMUSCULAR
  Filled 2012-12-20: qty 0.5

## 2012-12-20 MED ORDER — OXYCODONE-ACETAMINOPHEN 5-325 MG PO TABS: 1.0000 | ORAL_TABLET | ORAL | Status: DC | PRN

## 2012-12-20 MED ORDER — ONDANSETRON HCL 4 MG/2ML IJ SOLN: 4.0000 mg | INTRAMUSCULAR | Status: DC | PRN

## 2012-12-20 MED ORDER — LANOLIN HYDROUS EX OINT: TOPICAL_OINTMENT | CUTANEOUS | Status: DC | PRN

## 2012-12-20 MED ORDER — ZOLPIDEM TARTRATE 5 MG PO TABS: 5.0000 mg | ORAL_TABLET | Freq: Every evening | ORAL | Status: DC | PRN

## 2012-12-20 MED ORDER — WITCH HAZEL-GLYCERIN EX PADS: 1.0000 "application " | MEDICATED_PAD | CUTANEOUS | Status: DC | PRN

## 2012-12-20 MED ORDER — ALUM & MAG HYDROXIDE-SIMETH 200-200-20 MG/5ML PO SUSP
30.0000 mL | Freq: Four times a day (QID) | ORAL | Status: DC | PRN
  Administered 2012-12-20: 30 mL via ORAL
  Filled 2012-12-20 (×2): qty 30

## 2012-12-20 MED ORDER — IBUPROFEN 600 MG PO TABS
600.0000 mg | ORAL_TABLET | Freq: Four times a day (QID) | ORAL | Status: DC
Start: 2012-12-20 — End: 2012-12-21
  Administered 2012-12-20 – 2012-12-21 (×6): 600 mg via ORAL
  Filled 2012-12-20 (×6): qty 1

## 2012-12-20 MED ORDER — TETANUS-DIPHTH-ACELL PERTUSSIS 5-2.5-18.5 LF-MCG/0.5 IM SUSP: 0.5000 mL | Freq: Once | INTRAMUSCULAR | Status: DC

## 2012-12-20 MED ORDER — DIPHENHYDRAMINE HCL 25 MG PO CAPS: 25.0000 mg | ORAL_CAPSULE | Freq: Four times a day (QID) | ORAL | Status: DC | PRN

## 2012-12-20 MED ORDER — DIBUCAINE 1 % RE OINT: 1.0000 "application " | TOPICAL_OINTMENT | RECTAL | Status: DC | PRN

## 2012-12-20 NOTE — Progress Notes (Signed)
Post Partum Day 1 Subjective: no complaints and tolerating PO  Objective: Blood pressure 113/73, pulse 74, temperature 98.6 F (37 C), temperature source Oral, resp. rate 18, height 5\' 4"  (1.626 m), weight 79.833 kg (176 lb), SpO2 99.00%, unknown if currently breastfeeding.  Physical Exam:  General: alert and cooperative Lochia: appropriate Uterine Fundus: firm    Recent Labs  12/19/12 1340 12/20/12 0555  HGB 11.9* 10.7*  HCT 33.4* 30.8*    Assessment/Plan: Plan for discharge tomorrow   LOS: 1 day   Abas Leicht W 12/20/2012, 9:41 AM

## 2012-12-20 NOTE — Anesthesia Postprocedure Evaluation (Signed)
  Anesthesia Post-op Note  Patient: [name]  Procedure(s) Performed: * No procedures listed *  Patient Location: PACU and Mother/Baby  Anesthesia Type:Epidural  Level of Consciousness: awake, alert , oriented and patient cooperative  Airway and Oxygen Therapy: Patient Spontanous Breathing  Post-op Pain: none  Post-op Assessment: Post-op Vital signs reviewed, Patient's Cardiovascular Status Stable and Respiratory Function Stable  Post-op Vital Signs: Reviewed and stable  Complications: No apparent anesthesia complications

## 2012-12-21 NOTE — Discharge Summary (Signed)
Obstetric Discharge Summary Reason for Admission: rupture of membranes Prenatal Procedures: none Intrapartum Procedures: spontaneous vaginal delivery Postpartum Procedures: none Complications-Operative and Postpartum: small periurethral laceration Hemoglobin  Date Value Range Status  12/20/2012 10.7* 12.0 - 15.0 g/dL Final     HCT  Date Value Range Status  12/20/2012 30.8* 36.0 - 46.0 % Final    Physical Exam:  General: alert Lochia: appropriate Uterine Fundus: firm  Discharge Diagnoses: Term Pregnancy-delivered  Discharge Information: Date: 12/21/2012 Activity: pelvic rest Diet: routine Medications: Ibuprofen Condition: stable Instructions: refer to practice specific booklet Discharge to: home Follow-up Information   Follow up with Oliver Pila, MD In 6 weeks. (postpartum and mirena)    Specialty:  Obstetrics and Gynecology   Contact information:   [address], [address] [place] [address] [phone]       Newborn Data: Live born female  Birth Weight: 5 lb 4.8 oz (2404 g) APGAR: 9, 9  Home with mother.  [name] D 12/21/2012, 9:52 AM

## 2012-12-21 NOTE — Progress Notes (Signed)
PPD #2 No problems Afeb, VSS D/c home 

## 2012-12-22 ENCOUNTER — Encounter (HOSPITAL_COMMUNITY): Payer: Self-pay | Admitting: Anesthesiology

## 2012-12-22 ENCOUNTER — Inpatient Hospital Stay (HOSPITAL_COMMUNITY)
Admission: AD | Admit: 2012-12-22 | Discharge: 2012-12-26 | DRG: 776 | Disposition: A | Discharge status: Home / Self Care | Payer: Managed Care, Other (non HMO) | Source: Ambulatory Visit | Attending: Obstetrics and Gynecology | Admitting: Obstetrics and Gynecology

## 2012-12-22 ENCOUNTER — Encounter (HOSPITAL_COMMUNITY): Payer: Self-pay | Admitting: Family

## 2012-12-22 DIAGNOSIS — O8612 Endometritis following delivery: Principal | ICD-10-CM | POA: Diagnosis present

## 2012-12-22 LAB — CBC WITH DIFFERENTIAL/PLATELET
Basophils Absolute: 0 10*3/uL (ref 0.0–0.1)
HCT: 33.1 % — ABNORMAL LOW (ref 36.0–46.0)
Hemoglobin: 11.5 g/dL — ABNORMAL LOW (ref 12.0–15.0)
Lymphocytes Relative: 5 % — ABNORMAL LOW (ref 12–46)
Lymphs Abs: 0.9 10*3/uL (ref 0.7–4.0)
Monocytes Absolute: 0.8 10*3/uL (ref 0.1–1.0)
Neutro Abs: 14.8 10*3/uL — ABNORMAL HIGH (ref 1.7–7.7)
RBC: 3.89 MIL/uL (ref 3.87–5.11)
RDW: 12.9 % (ref 11.5–15.5)
WBC: 16.6 10*3/uL — ABNORMAL HIGH (ref 4.0–10.5)

## 2012-12-22 MED ORDER — OXYCODONE-ACETAMINOPHEN 5-325 MG PO TABS
1.0000 | ORAL_TABLET | ORAL | Status: DC | PRN
  Filled 2012-12-22: qty 1

## 2012-12-22 MED ORDER — IBUPROFEN 600 MG PO TABS
600.0000 mg | ORAL_TABLET | Freq: Four times a day (QID) | ORAL | Status: DC | PRN
  Administered 2012-12-22 – 2012-12-26 (×6): 600 mg via ORAL
  Filled 2012-12-22 (×6): qty 1

## 2012-12-22 MED ORDER — LACTATED RINGERS IV SOLN
INTRAVENOUS | Status: DC
  Administered 2012-12-22 – 2012-12-24 (×5): via INTRAVENOUS

## 2012-12-22 MED ORDER — HYDROMORPHONE HCL PF 1 MG/ML IJ SOLN
1.0000 mg | INTRAMUSCULAR | Status: DC | PRN
  Administered 2012-12-22 – 2012-12-23 (×2): 1 mg via INTRAVENOUS
  Filled 2012-12-22 (×2): qty 1

## 2012-12-22 MED ORDER — PROMETHAZINE HCL 25 MG/ML IJ SOLN
12.5000 mg | Freq: Four times a day (QID) | INTRAMUSCULAR | Status: DC | PRN
  Administered 2012-12-22: 12.5 mg via INTRAVENOUS
  Filled 2012-12-22: qty 1

## 2012-12-22 MED ORDER — SODIUM CHLORIDE 0.9 % IV SOLN
3.0000 g | Freq: Four times a day (QID) | INTRAVENOUS | Status: DC
  Administered 2012-12-22 – 2012-12-23 (×4): 3 g via INTRAVENOUS
  Filled 2012-12-22 (×6): qty 3

## 2012-12-22 NOTE — MAU Provider Note (Signed)
History     CSN: [id number]  Arrival date and time: 12/22/12 2007   First Provider Initiated Contact with Patient 12/22/12 2033      Chief Complaint  Patient presents with  . Postpartum Complications   HPI This is a 32 y.o. female who is 3 days postpartum who presents with c/o fever of 104 at home with onset of severe lower abdominal pain at 3am last night.  Took Advil and Goody powder with relief from fever but not pain. No pain in breasts. No dyspnea or chest pain. No leg pain. Had been having cramping postpartum, but this pain is "like knives stabbing".  RN Note: Patient presents to MAU s/p VD on 8/21. Reports severe intermittent abdominal cramping since 0300 today. Reports lochia has decreased in last couple of days. Reports urethral laceration repair at delivery. Denies GU/GI problems. Last BM 8/23.  Reports fever of 104 at home; took two Goody's powders at 1930 tonight; ibuprofen at 1500 today. Patient is breast and bottle feeding. Reports she began breastfeeding last night.   OB History   Grav Para Term Preterm Abortions TAB SAB Ect Mult Living   5 4 4  0 1 0 1 0 0 4      Past Medical History  Diagnosis Date  . Migraine     Past Surgical History  Procedure Laterality Date  . No past surgeries      History reviewed. No pertinent family history.  History  Substance Use Topics  . Smoking status: Current Every Day Smoker  . Smokeless tobacco: Current User  . Alcohol Use: Yes     Comment: occ    Allergies: No Known Allergies  Prescriptions prior to admission  Medication Sig Dispense Refill  . Aspirin-Acetaminophen-Caffeine (GOODY HEADACHE PO) Take 1 Package by mouth daily as needed (headaches).      Marland Kitchen ibuprofen (ADVIL,MOTRIN) 200 MG tablet Take 400 mg by mouth every 6 (six) hours as needed (cramps).      . Prenatal Vit-Fe Fumarate-FA (PRENATAL MULTIVITAMIN) TABS tablet Take 1 tablet by mouth daily at 12 noon.      . ranitidine (ZANTAC) 75 MG tablet Take 75 mg by  mouth 2 (two) times daily as needed for heartburn.       . SUMAtriptan (IMITREX) 100 MG tablet Take 50-100 mg by mouth once as needed for migraine.        Review of Systems  Constitutional: Positive for fever, chills and malaise/fatigue.  Respiratory: Negative for cough.   Cardiovascular: Negative for chest pain.  Gastrointestinal: Positive for abdominal pain. Negative for heartburn, nausea, vomiting, diarrhea and constipation.  Genitourinary: Negative for dysuria.  Musculoskeletal: Positive for myalgias.  Neurological: Negative for headaches.   Physical Exam   Blood pressure 130/92, pulse 126, temperature 98.8 F (37.1 C), temperature source Oral, resp. rate 22, unknown if currently breastfeeding.  Physical Exam  Constitutional: She is oriented to person, place, and time. She appears well-developed and well-nourished. She appears distressed (uncomfortable with pain).  HENT:  Head: Normocephalic.  Cardiovascular: Exam reveals no gallop and no friction rub.   No murmur (tachycardic, 110s to 120s) heard. Respiratory: Effort normal and breath sounds normal. No respiratory distress. She has no wheezes. She has no rales. She exhibits no tenderness.  GI: She exhibits no distension and no mass. There is tenderness (exquisitely tender lower abdomen). There is rebound and guarding.  Genitourinary: Uterus normal. Vaginal discharge (lochia, thin red, no odor, healing periurethral sutures) found.  Uterus soft, very tender  in lower segment   Musculoskeletal: Normal range of motion.  Neurological: She is alert and oriented to person, place, and time.  Skin: Skin is warm and dry.  Psychiatric: She has a normal mood and affect.    MAU Course  Procedures  MDM   Assessment and Plan  A:  Postpartum x 3 days      Probable endometritis  P:  Discussed with Dr Jackelyn Knife       He will admit her and give her IV antibiotics       Analgesia      CBC   Lucella Pommier 12/22/2012, 8:45 PM

## 2012-12-22 NOTE — MAU Note (Addendum)
Patient presents to MAU s/p VD on 8/21. Reports severe intermittent abdominal cramping since 0300 today. Reports lochia has decreased in last couple of days. Reports urethral laceration repair at delivery. Denies GU/GI problems. Last BM 8/23.  Reports fever of 104 at home; took two Goody's powders at 1930 tonight; ibuprofen at 1500 today. Patient is breast and bottle feeding. Reports she began breastfeeding last night.

## 2012-12-23 MED ORDER — PIPERACILLIN-TAZOBACTAM 3.375 G IVPB
3.3750 g | Freq: Three times a day (TID) | INTRAVENOUS | Status: DC
  Administered 2012-12-23 – 2012-12-26 (×8): 3.375 g via INTRAVENOUS
  Filled 2012-12-23 (×9): qty 50

## 2012-12-23 MED ORDER — HYDROCODONE-ACETAMINOPHEN 5-325 MG PO TABS
1.0000 | ORAL_TABLET | ORAL | Status: DC | PRN
  Administered 2012-12-23 (×4): 1 via ORAL
  Administered 2012-12-24: 2 via ORAL
  Filled 2012-12-23: qty 2
  Filled 2012-12-23 (×4): qty 1

## 2012-12-23 NOTE — Progress Notes (Signed)
Patient ID: [name], female   DOB: December 12, 1980, 32 y.o.   MRN: [id number] Pt reports that she got home from hospital Saturday and felt well until Sunday afternoon when she began to have chills and a fever as well as uterine cramping.  Temperature eventually reached 104 and she came in to hospital, started on IV Unasyn last pm and has received about 2 doses.  She denies any N/V/D or urinary symptoms.  Had blood cultures this AM.  She has not breastfed since arrival and is feeling engorged.  Bleeding has been WNL per pt. She reports still feeling tender and crampy this AM.  Tmax 101.4  Abd soft but diffusely tender in lower abdomen   Pt on IV Unasyn.  Received some dilaudid IV for pain and advised pt to try po first if going to breastfeed Presumed pp endometritis.  Risk factor was PROM with pt not presenting for evaluation until had been ruptured >24 hours--otherwise uneventful delivery. Will follow closely for improvement. Breastpump to bedside

## 2012-12-23 NOTE — Progress Notes (Signed)
Patient ID: [name], female   DOB: 09/24/1980, 32 y.o.   MRN: [id number] Pt still not feeling much better and abdomen still tender.  No other symptoms Temp spike again this pm to 102+  D/w pharmacy and will broaden coverage with Zosyn, continue to follow closely and if no better on that may need imaging +/- heparin

## 2012-12-23 NOTE — H&P (Signed)
History    CSN: [id number]  Arrival date and time: 12/22/12 2007  First Provider Initiated Contact with Patient 12/22/12 2033  Chief Complaint   Patient presents with   .  Postpartum Complications    HPI  This is a 32 y.o. female who is 3 days postpartum who presents with c/o fever of 104 at home with onset of severe lower abdominal pain at 3am last night. Took Advil and Goody powder with relief from fever but not pain. No pain in breasts. No dyspnea or chest pain. No leg pain. Had been having cramping postpartum, but this pain is "like knives stabbing".  RN Note:  Patient presents to MAU s/p VD on 8/21. Reports severe intermittent abdominal cramping since 0300 today. Reports lochia has decreased in last couple of days. Reports urethral laceration repair at delivery. Denies GU/GI problems. Last BM 8/23.  Reports fever of 104 at home; took two Goody's powders at 1930 tonight; ibuprofen at 1500 today. Patient is breast and bottle feeding. Reports she began breastfeeding last night.  OB History    Grav  Para  Term  Preterm  Abortions  TAB  SAB  Ect  Mult  Living    5  4  4   0  1  0  1  0  0  4      Past Medical History   Diagnosis  Date   .  Migraine     Past Surgical History   Procedure  Laterality  Date   .  No past surgeries      History reviewed. No pertinent family history.  History   Substance Use Topics   .  Smoking status:  Current Every Day Smoker   .  Smokeless tobacco:  Current User   .  Alcohol Use:  Yes      Comment: occ    Allergies: No Known Allergies  Prescriptions prior to admission   Medication  Sig  Dispense  Refill   .  Aspirin-Acetaminophen-Caffeine (GOODY HEADACHE PO)  Take 1 Package by mouth daily as needed (headaches).     Marland Kitchen  ibuprofen (ADVIL,MOTRIN) 200 MG tablet  Take 400 mg by mouth every 6 (six) hours as needed (cramps).     .  Prenatal Vit-Fe Fumarate-FA (PRENATAL MULTIVITAMIN) TABS tablet  Take 1 tablet by mouth daily at 12 noon.     .  ranitidine  (ZANTAC) 75 MG tablet  Take 75 mg by mouth 2 (two) times daily as needed for heartburn.     .  SUMAtriptan (IMITREX) 100 MG tablet  Take 50-100 mg by mouth once as needed for migraine.      Review of Systems  Constitutional: Positive for fever, chills and malaise/fatigue.  Respiratory: Negative for cough.  Cardiovascular: Negative for chest pain.  Gastrointestinal: Positive for abdominal pain. Negative for heartburn, nausea, vomiting, diarrhea and constipation.  Genitourinary: Negative for dysuria.  Musculoskeletal: Positive for myalgias.  Neurological: Negative for headaches.   Physical Exam   Blood pressure 130/92, pulse 126, temperature 98.8 F (37.1 C), temperature source Oral, resp. rate 22, unknown if currently breastfeeding.  Physical Exam  Constitutional: She is oriented to person, place, and time. She appears well-developed and well-nourished. She appears distressed (uncomfortable with pain).  HENT:  Head: Normocephalic.  Cardiovascular: Exam reveals no gallop and no friction rub.  No murmur (tachycardic, 110s to 120s) heard.  Respiratory: Effort normal and breath sounds normal. No respiratory distress. She has no wheezes. She has no rales.  She exhibits no tenderness.  GI: She exhibits no distension and no mass. There is tenderness (exquisitely tender lower abdomen). There is rebound and guarding.  Genitourinary: Uterus normal. Vaginal discharge (lochia, thin red, no odor, healing periurethral sutures) found.  Uterus soft, very tender in lower segment  Musculoskeletal: Normal range of motion.  Neurological: She is alert and oriented to person, place, and time.  Skin: Skin is warm and dry.  Psychiatric: She has a normal mood and affect.   MAU Course   Procedures  MDM  Assessment and Plan   A: Postpartum x 3 days  Probable endometritis  P: Discussed with Dr Jackelyn Knife  He will admit her and give her IV antibiotics  Analgesia  CBC  Pt had prolonged ROM prior to  delivery, presentation is c/w endometritis.  Admit for obs, started on Unasyn.

## 2012-12-23 NOTE — Progress Notes (Signed)
Ur chart review completed.  

## 2012-12-24 LAB — CBC WITH DIFFERENTIAL/PLATELET
Eosinophils Relative: 0 % (ref 0–5)
HCT: 31.1 % — ABNORMAL LOW (ref 36.0–46.0)
Lymphocytes Relative: 10 % — ABNORMAL LOW (ref 12–46)
Lymphs Abs: 1.1 10*3/uL (ref 0.7–4.0)
MCH: 30.3 pg (ref 26.0–34.0)
MCV: 87.1 fL (ref 78.0–100.0)
Monocytes Absolute: 0.6 10*3/uL (ref 0.1–1.0)
RBC: 3.57 MIL/uL — ABNORMAL LOW (ref 3.87–5.11)
RDW: 13.1 % (ref 11.5–15.5)
WBC: 10.7 10*3/uL — ABNORMAL HIGH (ref 4.0–10.5)

## 2012-12-24 MED ORDER — ACETAMINOPHEN 325 MG PO TABS
650.0000 mg | ORAL_TABLET | ORAL | Status: DC | PRN
  Administered 2012-12-25 – 2012-12-26 (×4): 650 mg via ORAL
  Filled 2012-12-24 (×4): qty 2

## 2012-12-24 MED ORDER — ACETAMINOPHEN 325 MG PO TABS
650.0000 mg | ORAL_TABLET | Freq: Once | ORAL | Status: AC
  Administered 2012-12-24: 650 mg via ORAL
  Filled 2012-12-24: qty 2

## 2012-12-24 MED ORDER — SUMATRIPTAN SUCCINATE 50 MG PO TABS
50.0000 mg | ORAL_TABLET | Freq: Once | ORAL | Status: AC
  Administered 2012-12-24: 50 mg via ORAL
  Filled 2012-12-24: qty 1

## 2012-12-24 MED ORDER — SUMATRIPTAN SUCCINATE 100 MG PO TABS
100.0000 mg | ORAL_TABLET | Freq: Once | ORAL | Status: AC
  Administered 2012-12-24: 50 mg via ORAL
  Filled 2012-12-24: qty 1

## 2012-12-24 NOTE — Progress Notes (Signed)
Patient ID: [name], female   DOB: [birth year], 32 y.o.   MRN: [id number] HD #3 Pt still having pain in her lower abdomen but improving Has not taken a vicodin since last PM Last temp was at 2AM when pt was receiving her 2nd dose of zosyn, no fever since then Is nursing and pumping Bleeding minimal WBC decreased from 16K to 10K this AM and prelim blood cx negative  D/w pt she has benn slow to respond, but hopefully the Zosyn is making a difference If she respikes on the Zosyn will get a CT scan

## 2012-12-25 DIAGNOSIS — O8612 Endometritis following delivery: Secondary | ICD-10-CM

## 2012-12-25 HISTORY — DX: Endometritis following delivery: O86.12

## 2012-12-25 MED ORDER — SUMATRIPTAN SUCCINATE 100 MG PO TABS
100.0000 mg | ORAL_TABLET | Freq: Once | ORAL | Status: AC
  Administered 2012-12-25: 100 mg via ORAL
  Filled 2012-12-25 (×2): qty 1

## 2012-12-25 NOTE — Progress Notes (Signed)
Patient ID: [name], female   DOB: July 24, 1980, 32 y.o.   MRN: [id number] Pt finally feeling much better.  Lower abdomen with minimal tenderness now  She does have a migraine that has not fully resolved Tmax 99+last pm at 2230  Zosyn day2/3 Endometritis  D/w pt I would like her to be fully afebrile for 24 hours prior to d/c given that it took the Zosyn to get improvement.  She is in agreement. Will allow off the floor if nursing able to accompany her, and hope for d/c in AM  Imitrex for HA.

## 2012-12-26 LAB — CBC
HCT: 27.3 % — ABNORMAL LOW (ref 36.0–46.0)
RDW: 12.8 % (ref 11.5–15.5)
WBC: 6 10*3/uL (ref 4.0–10.5)

## 2012-12-26 NOTE — Discharge Summary (Signed)
Physician Discharge Summary  Patient ID: [name] MRN: [id number] DOB/AGE: [birth year] 32 y.o.  Admit date: 12/22/2012 Discharge date: 12/26/2012  Admission Diagnoses: Postpartum endometritis  Discharge Diagnoses:  Active Problems:   Endometritis following delivery   Discharged Condition: good  Hospital Course: Pt was admitted 3 days s/p uncomplicated NSVD with fever spiking to 104 at home.  She was noted to have significant uterine tenderness c/w endometritis and was admitted and placed on IV Unasyn.  Her fever did not improved after almost 24 hours of Unasyn so she was changed to Zosyn and then improved and became afebrile for more than 24 hours prior to her discharge.  SHe received over 48 hour of the Zosyn.Her bleeding was normal the entire stay and pain resolved with the fever. WBC decreased from 16K to 10K then 6K. Blood cultures were negative.  Consults: None  Significant Diagnostic Studies: labs: CBC, Blood cultures  Treatments: antibiotics: Zosyn and Unasyn  Discharge Exam: Blood pressure 126/84, pulse 65, temperature 98.7 F (37.1 C), temperature source Oral, resp. rate 16, height 5\' 4"  (1.626 m), weight 76.998 kg (169 lb 12 oz), SpO2 93.00%, unknown if currently breastfeeding. General appearance: alert and cooperative Cardio: regular rate and rhythm GI: soft and NT  Disposition: 01-Home or Self Care      Discharge Orders   Future Orders Complete By Expires   Diet - low sodium heart healthy  As directed    Discharge instructions  As directed    Comments:     Nothing in vagina for 5 weeks.  Call for temp greater than 101.   Increase activity slowly  As directed        Medication List    STOP taking these medications       GOODY HEADACHE PO      TAKE these medications       ibuprofen 200 MG tablet  Commonly known as:  ADVIL,MOTRIN  Take 400 mg by mouth every 6 (six) hours as needed (cramps).     prenatal multivitamin Tabs tablet  Take 1 tablet by  mouth daily at 12 noon.     ranitidine 75 MG tablet  Commonly known as:  ZANTAC  Take 75 mg by mouth 2 (two) times daily as needed for heartburn.     SUMAtriptan 100 MG tablet  Commonly known as:  IMITREX  Take 50-100 mg by mouth once as needed for migraine.       Follow-up Information   Follow up with Oliver Pila, MD. Schedule an appointment as soon as possible for a visit in 5 weeks. (postpartum visit)    Specialty:  Obstetrics and Gynecology   Contact information:   [address], [address] [place] [address] [phone]       Signed: Oliver Pila 12/26/2012, 8:49 AM

## 2012-12-26 NOTE — Progress Notes (Signed)
Patient ID: [name], female   DOB: 06/28/1980, 32 y.o.   MRN: [id number] Pt feeling much better, still some intermittent HA, but abdominal pain completely resolved afeb VSS no temp in >24 hours Abdomen soft NT  Will d/c home  F/u in 5 weeks for full pp exam Has imitrex at home and declines other pain meds

## 2012-12-29 LAB — CULTURE, BLOOD (ROUTINE X 2): Culture: NO GROWTH

## 2013-03-06 ENCOUNTER — Other Ambulatory Visit: Payer: Self-pay

## 2013-06-20 ENCOUNTER — Other Ambulatory Visit: Payer: Self-pay | Admitting: Occupational Medicine

## 2013-06-20 ENCOUNTER — Ambulatory Visit: Payer: Self-pay

## 2013-06-20 DIAGNOSIS — M25532 Pain in left wrist: Secondary | ICD-10-CM

## 2014-03-02 ENCOUNTER — Encounter (HOSPITAL_COMMUNITY): Payer: Self-pay | Admitting: Family

## 2015-11-28 DIAGNOSIS — G43909 Migraine, unspecified, not intractable, without status migrainosus: Secondary | ICD-10-CM | POA: Diagnosis not present

## 2016-04-13 ENCOUNTER — Other Ambulatory Visit (HOSPITAL_COMMUNITY)
Admission: RE | Admit: 2016-04-13 | Discharge: 2016-04-13 | Disposition: A | Discharge status: Home / Self Care | Payer: BLUE CROSS/BLUE SHIELD | Source: Ambulatory Visit | Attending: Obstetrics and Gynecology | Admitting: Obstetrics and Gynecology

## 2016-04-13 ENCOUNTER — Encounter: Payer: Self-pay | Admitting: Obstetrics and Gynecology

## 2016-04-13 ENCOUNTER — Ambulatory Visit (INDEPENDENT_AMBULATORY_CARE_PROVIDER_SITE_OTHER): Payer: Medicaid Other | Admitting: Obstetrics and Gynecology

## 2016-04-13 VITALS — BP 137/93 | HR 95 | Ht 62.0 in | Wt 174.8 lb

## 2016-04-13 DIAGNOSIS — Z1151 Encounter for screening for human papillomavirus (HPV): Secondary | ICD-10-CM | POA: Diagnosis not present

## 2016-04-13 DIAGNOSIS — Z01419 Encounter for gynecological examination (general) (routine) without abnormal findings: Secondary | ICD-10-CM | POA: Diagnosis not present

## 2016-04-13 DIAGNOSIS — Z0000 Encounter for general adult medical examination without abnormal findings: Secondary | ICD-10-CM

## 2016-04-13 NOTE — Patient Instructions (Signed)
Intrauterine Device Information Introduction An intrauterine device (IUD) is a medical device that gets inserted into the uterus to prevent pregnancy. It is a small, T-shaped device that has one or two nylon strings hanging down from it. The strings hang out of the lower part of the uterus (cervix) to allow for future IUD removal. There are two types of IUDs available:  Copper IUD. This type of IUD has copper wire wrapped around it. A copper IUD may last up to 10 years.  Hormone IUD. This type of IUD is made of plastic and contains the hormone progestin (synthetic progesterone). A hormone IUD may last 3 to 5 years. IUDs are inserted through the vagina and placed into the uterus with a minor medical procedure. How does the IUD work? Copper in the copper IUD prevents pregnancy by making the uterus and fallopian tubes produce a fluid that kills sperm. Synthetic progesterone in hormonal IUD prevents pregnancy by:  Thickening cervical mucus to prevent sperm from entering the uterus.  Thinning the uterine lining to prevent implantation of a fertilized egg.  Weakening or killing sperm that get into the uterus. What are the advantages of an IUD?  IUDs are highly effective, reversible, long-acting, and low-maintenance.  There are no estrogen-related side effects.  An IUD can be used when breastfeeding.  IUDs are not associated with weight gain.  Advantages of the copper IUD are that:  It works immediately after insertion.  It does not interfere with your body's natural hormones.  It can be used for 10 years.  Advantages of the hormone IUD are that:  If it is inserted within 7 days of your period starting, it works immediately after insertion. If the hormone IUD is inserted at any other time in your cycle, you will need to use a backup method of birth control for 7 days after insertion.  It can make menstrual periods lighter.  It can decrease menstrual cramping.  It can be used for 3  or 5 years. What are the disadvantages of an IUD?  The hormone IUD may cause irregular menstrual bleeding for a period of time after insertion.  The copper IUD can make your menstrual flow heavier and more painful.  You may experience some pain during insertion, and cramping and vaginal bleeding after insertion. How is the IUD removed? Is the IUD right for me?  Your health care provider will make sure you are a good candidate for an IUD and will discuss side effects with you. This information is not intended to replace advice given to you by your health care provider. Make sure you discuss any questions you have with your health care provider. Document Released: 03/21/2004 Document Revised: 09/23/2015 Document Reviewed: 10/06/2012  2017 Elsevier  

## 2016-04-13 NOTE — Progress Notes (Signed)
Subjective:     [name] is a 35 y.o. female who is here for a comprehensive physical exam. The patient reports migraine headaches triggered by her Mirena IUD which she has had in place for the past 4 years. She wants to change birth control but desires to discuss options before IUD removal. Her headaches are currently being treated with topomax but she reports a daily headaches for 3 weeks out of the month. She is otherwise without complaints. She reports a monthly period of 3-4 days, very light. She denies any pelvic/abdominal pain.   Past Medical History:  Diagnosis Date  . Kidney disease   . Migraine    Past Surgical History:  Procedure Laterality Date  . APPENDECTOMY    . NO PAST SURGERIES     Family History  Problem Relation Age of Onset  . Hypertension Mother     Social History   Social History  . Marital status: Married    Spouse name: N/A  . Number of children: N/A  . Years of education: N/A   Occupational History  . Not on file.   Social History Main Topics  . Smoking status: Current Every Day Smoker  . Smokeless tobacco: Current User     Comment: smokes 3-4/day  . Alcohol use Yes     Comment: occ  . Drug use: No  . Sexual activity: Yes    Partners: Male    Birth control/ protection: IUD   Other Topics Concern  . Not on file   Social History Narrative  . No narrative on file   Health Maintenance  Topic Date Due  . TETANUS/TDAP  03/26/2000  . PAP SMEAR  03/26/2002  . INFLUENZA VACCINE  11/30/2015  . HIV Screening  Completed       Review of Systems Pertinent items are noted in HPI.   Objective:  Blood pressure (!) 137/93, pulse 95, height 5\' 2"  (1.575 m), weight 174 lb 12.8 oz (79.3 kg), last menstrual period 04/08/2016, not currently breastfeeding.     GENERAL: Well-developed, well-nourished female in no acute distress.  HEENT: Normocephalic, atraumatic. Sclerae anicteric.  NECK: Supple. Normal thyroid.  LUNGS: Clear to auscultation  bilaterally.  HEART: Regular rate and rhythm. BREASTS: Symmetric in size. No palpable masses or lymphadenopathy, skin changes, or nipple drainage. ABDOMEN: Soft, nontender, nondistended. No organomegaly. PELVIC: Normal external female genitalia. Vagina is pink and rugated.  Normal discharge. Normal appearing cervix. Uterus is normal in size. No adnexal mass or tenderness. EXTREMITIES: No cyanosis, clubbing, or edema, 2+ distal pulses.    Assessment:    Healthy female exam.      Plan:    pap smear collected Birth control options reviewed- discussed copper IUD. Patient is also considering BTL Referred patient to headache specialist in [place] Patient will be contacted with any abnormal results RTC for contraception management See After Visit Summary for Counseling Recommendations

## 2016-04-14 ENCOUNTER — Encounter: Payer: Self-pay | Admitting: Physician Assistant

## 2016-04-14 ENCOUNTER — Ambulatory Visit (INDEPENDENT_AMBULATORY_CARE_PROVIDER_SITE_OTHER): Payer: BLUE CROSS/BLUE SHIELD | Admitting: Physician Assistant

## 2016-04-14 VITALS — BP 140/84 | HR 99 | Resp 20 | Ht 63.0 in | Wt 174.0 lb

## 2016-04-14 DIAGNOSIS — M62838 Other muscle spasm: Secondary | ICD-10-CM | POA: Diagnosis not present

## 2016-04-14 DIAGNOSIS — G43101 Migraine with aura, not intractable, with status migrainosus: Secondary | ICD-10-CM | POA: Diagnosis not present

## 2016-04-14 MED ORDER — SUMATRIPTAN SUCCINATE 100 MG PO TABS: 100.0000 mg | ORAL_TABLET | Freq: Once | ORAL | 11 refills | Status: DC | PRN

## 2016-04-14 MED ORDER — TOPIRAMATE 25 MG PO TABS
200.0000 mg | ORAL_TABLET | Freq: Every morning | ORAL | 1 refills | Status: DC
Start: 2016-04-14 — End: 2016-05-19

## 2016-04-14 NOTE — Progress Notes (Signed)
History:  Sarah MoraleJennifer Robley is a 35 y.o. [id number] who presents to clinic today for initial eval of migraines.  She has severe HA usually Left orbital but sometimes right.  It feels like a pressure and there is nasal congestion on the same side.  Movement makes it worse along with bright lights and loud noises.  There is nausea and vomiting.  She often awakens with them.  With daytime HA, she has "black gnats" in her peripheral vision.   These started at the age of 89 or 5.  She has multiple family members with them.   She was on Topamax 200mg  daily and Imitrex prn.  She has been out of medications since September because her doctor no longer accepts her insurance.    She has had a HA every day this month and is miserable.  She requests acute treatment today and is agreeable to trial TPI/nerve block.    HIT6:74 Number of days in the last 4 weeks with:  Severe headache:9 Moderate headache: 10 Mild headache: 9  No headache: 0   Past Medical History:  Diagnosis Date  . Kidney disease   . Migraine     Social History   Social History  . Marital status: Married    Spouse name: N/A  . Number of children: N/A  . Years of education: N/A   Occupational History  . Not on file.   Social History Main Topics  . Smoking status: Current Every Day Smoker    Packs/day: 0.25    Types: Cigarettes  . Smokeless tobacco: Current User     Comment: smokes 3-4/day  . Alcohol use Yes     Comment: occ  . Drug use: No  . Sexual activity: Yes    Partners: Male    Birth control/ protection: IUD   Other Topics Concern  . Not on file   Social History Narrative  . No narrative on file    Family History  Problem Relation Age of Onset  . Hypertension Mother   . Cervical cancer Mother     Precancerous    No Known Allergies  Current Outpatient Prescriptions on File Prior to Visit  Medication Sig Dispense Refill  . levonorgestrel (MIRENA) 20 MCG/24HR IUD 1 each by Intrauterine route once.    .  SUMAtriptan (IMITREX) 100 MG tablet Take 50-100 mg by mouth once as needed for migraine.     No current facility-administered medications on file prior to visit.      Review of Systems:  All pertinent positive/negative included in HPI, all other review of systems are negative   Objective:  Physical Exam BP 140/84 (BP Location: Left Arm, Patient Position: Sitting, Cuff Size: Normal)   Pulse 99   Resp 20   Ht 5\' 3"  (1.6 m)   Wt 174 lb (78.9 kg)   LMP 04/14/2016 Comment: IUD placed 12/2012  BMI 30.82 kg/m  CONSTITUTIONAL: Well-developed, well-nourished female in no acute distress.  EYES: EOM intact ENT: Normocephalic CARDIOVASCULAR: Regular rate RESPIRATORY: Normal rate.  MUSCULOSKELETAL: Normal ROM, strength equal bilaterally, bilat muscle spasm noted SKIN: Warm, dry without erythema  NEUROLOGICAL: Alert, oriented, CN II-XII grossly intact, Appropriate balance PSYCH: Normal behavior, mood  PROCEDURE:  TRIGGER POINT INJECTIONS  Procedure: Mixture of 1%  Lidocaine, marcaine and dexamethazone in a ratio of 2:2:1  injected with 1cc each site in bilateral greater Occipital Nerves, bilateral lesser occipital nerves, bilateral cervical paraspinal muscles, bilateral trapezius.  Total amount injected: 10cc.  Pt tolerated procedure well.  Assessment & Plan:  Assessment: 1. Muscle spasm   2. Migraine with aura and with status migrainosus, not intractable    Both new problems   Plan: Restart Topamax with 25mg  daily and titrating as tolerated up to 200mg  daily Restart Imitrex for acute treatment of migraine Use OTC NSAID along with imitrex rather than Goody Powders or Excedrin May need Effexor in future Will discuss smoking/other lifestyle modifications in the future Follow-up in 6 weeks or sooner PRN  Bertram DenverKaren E Teague Clark, PA-C 04/14/2016 8:27 AM

## 2016-04-14 NOTE — Patient Instructions (Signed)
Migraine Headache A migraine headache is an intense, throbbing pain on one side or both sides of the head. Migraines may also cause other symptoms, such as nausea, vomiting, and sensitivity to light and noise. What are the causes? Doing or taking certain things may also trigger migraines, such as:  Alcohol.  Smoking.  Medicines, such as: ? Medicine used to treat chest pain (nitroglycerine). ? Birth control pills. ? Estrogen pills. ? Certain blood pressure medicines.  Aged cheeses, chocolate, or caffeine.  Foods or drinks that contain nitrates, glutamate, aspartame, or tyramine.  Physical activity.  Other things that may trigger a migraine include:  Menstruation.  Pregnancy.  Hunger.  Stress, lack of sleep, too much sleep, or fatigue.  Weather changes.  What increases the risk? The following factors may make you more likely to experience migraine headaches:  Age. Risk increases with age.  Family history of migraine headaches.  Being Caucasian.  Depression and anxiety.  Obesity.  Being a woman.  Having a hole in the heart (patent foramen ovale) or other heart problems.  What are the signs or symptoms? The main symptom of this condition is pulsating or throbbing pain. Pain may:  Happen in any area of the head, such as on one side or both sides.  Interfere with daily activities.  Get worse with physical activity.  Get worse with exposure to bright lights or loud noises.  Other symptoms may include:  Nausea.  Vomiting.  Dizziness.  General sensitivity to bright lights, loud noises, or smells.  Before you get a migraine, you may get warning signs that a migraine is developing (aura). An aura may include:  Seeing flashing lights or having blind spots.  Seeing bright spots, halos, or zigzag lines.  Having tunnel vision or blurred vision.  Having numbness or a tingling feeling.  Having trouble talking.  Having muscle weakness.  How is this  diagnosed? A migraine headache can be diagnosed based on:  Your symptoms.  A physical exam.  Tests, such as CT scan or MRI of the head. These imaging tests can help rule out other causes of headaches.  Taking fluid from the spine (lumbar puncture) and analyzing it (cerebrospinal fluid analysis, or CSF analysis).  How is this treated? A migraine headache is usually treated with medicines that:  Relieve pain.  Relieve nausea.  Prevent migraines from coming back.  Treatment may also include:  Acupuncture.  Lifestyle changes like avoiding foods that trigger migraines.  Follow these instructions at home: Medicines  Take over-the-counter and prescription medicines only as told by your health care provider.  Do not drive or use heavy machinery while taking prescription pain medicine.  To prevent or treat constipation while you are taking prescription pain medicine, your health care provider may recommend that you: ? Drink enough fluid to keep your urine clear or pale yellow. ? Take over-the-counter or prescription medicines. ? Eat foods that are high in fiber, such as fresh fruits and vegetables, whole grains, and beans. ? Limit foods that are high in fat and processed sugars, such as fried and sweet foods. Lifestyle  Avoid alcohol use.  Do not use any products that contain nicotine or tobacco, such as cigarettes and e-cigarettes. If you need help quitting, ask your health care provider.  Get at least 8 hours of sleep every night.  Limit your stress. General instructions   Keep a journal to find out what may trigger your migraine headaches. For example, write down: ? What you eat and   drink. ? How much sleep you get. ? Any change to your diet or medicines.  If you have a migraine: ? Avoid things that make your symptoms worse, such as bright lights. ? It may help to lie down in a dark, quiet room. ? Do not drive or use heavy machinery. ? Ask your health care provider  what activities are safe for you while you are experiencing symptoms.  Keep all follow-up visits as told by your health care provider. This is important. Contact a health care provider if:  You develop symptoms that are different or more severe than your usual migraine symptoms. Get help right away if:  Your migraine becomes severe.  You have a fever.  You have a stiff neck.  You have vision loss.  Your muscles feel weak or like you cannot control them.  You start to lose your balance often.  You develop trouble walking.  You faint. This information is not intended to replace advice given to you by your health care provider. Make sure you discuss any questions you have with your health care provider. Document Released: 04/17/2005 Document Revised: 11/05/2015 Document Reviewed: 10/04/2015 Elsevier Interactive Patient Education  2017 Elsevier Inc[employer]   

## 2016-04-18 LAB — CYTOLOGY - PAP
Diagnosis: NEGATIVE
HPV (WINDOPATH): NOT DETECTED

## 2016-04-18 LAB — NUSWAB VG, CANDIDA 6SP
CANDIDA ALBICANS, NAA: POSITIVE — AB
CANDIDA GLABRATA, NAA: NEGATIVE
Candida krusei, NAA: NEGATIVE
Candida lusitaniae, NAA: NEGATIVE
Candida parapsilosis, NAA: NEGATIVE
Candida tropicalis, NAA: NEGATIVE
TRICH VAG BY NAA: NEGATIVE

## 2016-04-21 ENCOUNTER — Other Ambulatory Visit: Payer: Self-pay | Admitting: Obstetrics and Gynecology

## 2016-04-21 MED ORDER — FLUCONAZOLE 150 MG PO TABS: 150.0000 mg | ORAL_TABLET | Freq: Once | ORAL | 0 refills | Status: AC

## 2016-05-04 ENCOUNTER — Encounter: Payer: Self-pay | Admitting: *Deleted

## 2016-05-19 ENCOUNTER — Encounter: Payer: Self-pay | Admitting: Physician Assistant

## 2016-05-19 ENCOUNTER — Other Ambulatory Visit: Payer: Self-pay | Admitting: Physician Assistant

## 2016-05-19 ENCOUNTER — Ambulatory Visit (INDEPENDENT_AMBULATORY_CARE_PROVIDER_SITE_OTHER): Payer: Medicaid Other | Admitting: Physician Assistant

## 2016-05-19 VITALS — BP 117/83 | HR 93 | Resp 18 | Ht 64.0 in | Wt 172.0 lb

## 2016-05-19 DIAGNOSIS — G43109 Migraine with aura, not intractable, without status migrainosus: Secondary | ICD-10-CM

## 2016-05-19 MED ORDER — TOPIRAMATE 100 MG PO TABS: 150.0000 mg | ORAL_TABLET | Freq: Two times a day (BID) | ORAL | 3 refills | Status: DC

## 2016-05-19 NOTE — Patient Instructions (Signed)
Migraine Headache A migraine headache is an intense, throbbing pain on one side or both sides of the head. Migraines may also cause other symptoms, such as nausea, vomiting, and sensitivity to light and noise. What are the causes? Doing or taking certain things may also trigger migraines, such as:  Alcohol.  Smoking.  Medicines, such as: ? Medicine used to treat chest pain (nitroglycerine). ? Birth control pills. ? Estrogen pills. ? Certain blood pressure medicines.  Aged cheeses, chocolate, or caffeine.  Foods or drinks that contain nitrates, glutamate, aspartame, or tyramine.  Physical activity.  Other things that may trigger a migraine include:  Menstruation.  Pregnancy.  Hunger.  Stress, lack of sleep, too much sleep, or fatigue.  Weather changes.  What increases the risk? The following factors may make you more likely to experience migraine headaches:  Age. Risk increases with age.  Family history of migraine headaches.  Being Caucasian.  Depression and anxiety.  Obesity.  Being a woman.  Having a hole in the heart (patent foramen ovale) or other heart problems.  What are the signs or symptoms? The main symptom of this condition is pulsating or throbbing pain. Pain may:  Happen in any area of the head, such as on one side or both sides.  Interfere with daily activities.  Get worse with physical activity.  Get worse with exposure to bright lights or loud noises.  Other symptoms may include:  Nausea.  Vomiting.  Dizziness.  General sensitivity to bright lights, loud noises, or smells.  Before you get a migraine, you may get warning signs that a migraine is developing (aura). An aura may include:  Seeing flashing lights or having blind spots.  Seeing bright spots, halos, or zigzag lines.  Having tunnel vision or blurred vision.  Having numbness or a tingling feeling.  Having trouble talking.  Having muscle weakness.  How is this  diagnosed? A migraine headache can be diagnosed based on:  Your symptoms.  A physical exam.  Tests, such as CT scan or MRI of the head. These imaging tests can help rule out other causes of headaches.  Taking fluid from the spine (lumbar puncture) and analyzing it (cerebrospinal fluid analysis, or CSF analysis).  How is this treated? A migraine headache is usually treated with medicines that:  Relieve pain.  Relieve nausea.  Prevent migraines from coming back.  Treatment may also include:  Acupuncture.  Lifestyle changes like avoiding foods that trigger migraines.  Follow these instructions at home: Medicines  Take over-the-counter and prescription medicines only as told by your health care provider.  Do not drive or use heavy machinery while taking prescription pain medicine.  To prevent or treat constipation while you are taking prescription pain medicine, your health care provider may recommend that you: ? Drink enough fluid to keep your urine clear or pale yellow. ? Take over-the-counter or prescription medicines. ? Eat foods that are high in fiber, such as fresh fruits and vegetables, whole grains, and beans. ? Limit foods that are high in fat and processed sugars, such as fried and sweet foods. Lifestyle  Avoid alcohol use.  Do not use any products that contain nicotine or tobacco, such as cigarettes and e-cigarettes. If you need help quitting, ask your health care provider.  Get at least 8 hours of sleep every night.  Limit your stress. General instructions   Keep a journal to find out what may trigger your migraine headaches. For example, write down: ? What you eat and   drink. ? How much sleep you get. ? Any change to your diet or medicines.  If you have a migraine: ? Avoid things that make your symptoms worse, such as bright lights. ? It may help to lie down in a dark, quiet room. ? Do not drive or use heavy machinery. ? Ask your health care provider  what activities are safe for you while you are experiencing symptoms.  Keep all follow-up visits as told by your health care provider. This is important. Contact a health care provider if:  You develop symptoms that are different or more severe than your usual migraine symptoms. Get help right away if:  Your migraine becomes severe.  You have a fever.  You have a stiff neck.  You have vision loss.  Your muscles feel weak or like you cannot control them.  You start to lose your balance often.  You develop trouble walking.  You faint. This information is not intended to replace advice given to you by your health care provider. Make sure you discuss any questions you have with your health care provider. Document Released: 04/17/2005 Document Revised: 11/05/2015 Document Reviewed: 10/04/2015 Elsevier Interactive Patient Education  2017 Elsevier Inc[employer]   

## 2016-05-19 NOTE — Progress Notes (Signed)
History:  [name] is a 36 y.o. [id number] who presents to clinic today for follow up of migraine headaches.  It has been a difficult month in general, with all her kids being sick with stomach virus and one son involved in a major car accident, requiring continuing medical care.   She restarted her Topamax but has yet to see benefit from that.  She is having some tingling/pins and needles on the 150mg  dose.  She is using vitamin B6 without much help.  She is interested in continuing to increase to 200mg .   We discussed last time potentially using Effexor for prevention.  She is not interested in that at this time.  She feels strongly her HAs have been worse since having her Mirena inserted 3 years ago.  She cannot use estrogen due having aura with migraine.  She may consider changing her contraceptive method to help improve HA.    HIT6:70 Number of days in the last 4 weeks with:  Severe headache: 8 Moderate headache: 9 Mild headache: 5  No headache: 6   Past Medical History:  Diagnosis Date  . Kidney disease   . Migraine     Social History   Social History  . Marital status: Married    Spouse name: N/A  . Number of children: N/A  . Years of education: N/A   Occupational History  . Not on file.   Social History Main Topics  . Smoking status: Current Every Day Smoker    Packs/day: 0.25    Types: Cigarettes  . Smokeless tobacco: Current User     Comment: smokes 3-4/day  . Alcohol use Yes     Comment: occ  . Drug use: No  . Sexual activity: Yes    Partners: Male    Birth control/ protection: IUD   Other Topics Concern  . Not on file   Social History Narrative  . No narrative on file    Family History  Problem Relation Age of Onset  . Hypertension Mother   . Cervical cancer Mother     Precancerous    No Known Allergies  Current Outpatient Prescriptions on File Prior to Visit  Medication Sig Dispense Refill  . levonorgestrel (MIRENA) 20 MCG/24HR IUD 1 each by  Intrauterine route once.    . SUMAtriptan (IMITREX) 100 MG tablet Take 1 tablet (100 mg total) by mouth once as needed for migraine. May repeat in 2 hours if headache persists or recurs. 9 tablet 11  . topiramate (TOPAMAX) 25 MG tablet Take 8 tablets (200 mg total) by mouth every morning. Increase as tolerated from 25mg  daily to 200mg  daily. 240 tablet 1  . buPROPion (WELLBUTRIN XL) 150 MG 24 hr tablet Take 150 mg by mouth daily.    . busPIRone (BUSPAR) 15 MG tablet Take 15 mg by mouth 2 (two) times daily.    Marland Kitchen LORazepam (ATIVAN) 0.5 MG tablet Take 0.5 mg by mouth daily.     No current facility-administered medications on file prior to visit.      Review of Systems:  All pertinent positive/negative included in HPI, all other review of systems are negative   Objective:  Physical Exam BP 117/83 (BP Location: Left Arm, Patient Position: Sitting, Cuff Size: Normal)   Pulse 93   Resp 18   Ht 5\' 4"  (1.626 m)   Wt 172 lb (78 kg)   LMP 04/14/2016   BMI 29.52 kg/m  CONSTITUTIONAL: Well-developed, well-nourished female in no acute distress.  EYES:  EOM intact ENT: Normocephalic CARDIOVASCULAR: Regular rate  RESPIRATORY: Normal rate.  MUSCULOSKELETAL: Normal ROM, strength equal bilaterally, muscle spasm noted SKIN: Warm, dry without erythema  NEUROLOGICAL: Alert, oriented, CN II-XII grossly intact, Appropriate balance PSYCH: Normal behavior, mood   Assessment & Plan:  Assessment: Migraine with aura - unimproved  Plan: Will increase Topamax to 200mg  as tolerated Continue Imitrex and Vit B6 as needed Pt not presently interested to trial Effexor Pt plans to see syn to discuss non-hormonal method of contraception (ParaGard/BTL) in hopes of reducing migraine.  Follow-up in 2-3 months or sooner PRN  Bertram DenverKaren E Teague Clark, PA-C 05/19/2016 10:50 AM

## 2016-07-14 ENCOUNTER — Other Ambulatory Visit: Payer: Self-pay | Admitting: Physician Assistant

## 2016-07-14 ENCOUNTER — Encounter: Payer: Self-pay | Admitting: Physician Assistant

## 2016-07-14 ENCOUNTER — Ambulatory Visit (INDEPENDENT_AMBULATORY_CARE_PROVIDER_SITE_OTHER): Payer: Medicaid Other | Admitting: Physician Assistant

## 2016-07-14 VITALS — BP 136/88 | HR 89 | Resp 18 | Ht 63.75 in | Wt 169.0 lb

## 2016-07-14 DIAGNOSIS — G4489 Other headache syndrome: Secondary | ICD-10-CM

## 2016-07-14 DIAGNOSIS — G43109 Migraine with aura, not intractable, without status migrainosus: Secondary | ICD-10-CM

## 2016-07-14 DIAGNOSIS — M62838 Other muscle spasm: Secondary | ICD-10-CM

## 2016-07-14 MED ORDER — TOPIRAMATE 200 MG PO TABS: 200.0000 mg | ORAL_TABLET | Freq: Two times a day (BID) | ORAL | 3 refills | Status: DC

## 2016-07-14 MED ORDER — CYCLOBENZAPRINE HCL 10 MG PO TABS: 10.0000 mg | ORAL_TABLET | Freq: Three times a day (TID) | ORAL | 1 refills | Status: DC | PRN

## 2016-07-14 MED ORDER — FLUCONAZOLE 150 MG PO TABS: 150.0000 mg | ORAL_TABLET | Freq: Every day | ORAL | 0 refills | Status: DC

## 2016-07-14 MED ORDER — FROVATRIPTAN SUCCINATE 2.5 MG PO TABS: 2.5000 mg | ORAL_TABLET | ORAL | 0 refills | Status: DC | PRN

## 2016-07-14 NOTE — Patient Instructions (Signed)

## 2016-07-14 NOTE — Progress Notes (Signed)
History:  [name] is a 36 y.o. [id number] who presents to clinic today for followup of headaches.  Her allergies have been worse.  She had a week in February in which she had a migraine that lasted 7 days.  Imitrex helped but the pain would come right back.  Ibuprofen also used.  Topamax is mildly helpful.  She is not having trouble with side effects from this now.   Patient is planning to have BTL soon and will have IUD removed.  She is expecting this to have a positive effect on her HAs.    HIT6:62 Number of days in the last 4 weeks with:  Severe headache: 7 Moderate headache: 9 Mild headache: 4  No headache: 8   Past Medical History:  Diagnosis Date  . Kidney disease   . Migraine     Social History   Social History  . Marital status: Married    Spouse name: N/A  . Number of children: N/A  . Years of education: N/A   Occupational History  . Not on file.   Social History Main Topics  . Smoking status: Current Every Day Smoker    Packs/day: 0.00    Types: Cigarettes  . Smokeless tobacco: Current User     Comment: smokes 3-4/day  . Alcohol use Yes     Comment: occ  . Drug use: No  . Sexual activity: Yes    Partners: Male    Birth control/ protection: IUD   Other Topics Concern  . Not on file   Social History Narrative  . No narrative on file    Family History  Problem Relation Age of Onset  . Hypertension Mother   . Cervical cancer Mother     Precancerous    No Known Allergies  Current Outpatient Prescriptions on File Prior to Visit  Medication Sig Dispense Refill  . levonorgestrel (MIRENA) 20 MCG/24HR IUD 1 each by Intrauterine route once.    . SUMAtriptan (IMITREX) 100 MG tablet Take 1 tablet (100 mg total) by mouth once as needed for migraine. May repeat in 2 hours if headache persists or recurs. 9 tablet 11  . topiramate (TOPAMAX) 100 MG tablet TAKE 1 AND 1/2 TABLETS(150 MG) BY MOUTH TWICE DAILY 270 tablet 3  . buPROPion (WELLBUTRIN XL) 150 MG 24  hr tablet Take 150 mg by mouth daily.    . busPIRone (BUSPAR) 15 MG tablet Take 15 mg by mouth 2 (two) times daily.    Marland Kitchen. LORazepam (ATIVAN) 0.5 MG tablet Take 0.5 mg by mouth daily.     No current facility-administered medications on file prior to visit.      Review of Systems:  All pertinent positive/negative included in HPI, all other review of systems are negative   Objective:  Physical Exam BP 136/88 (BP Location: Left Arm, Patient Position: Sitting, Cuff Size: Normal)   Pulse 89   Resp 18   Ht 5' 3.75" (1.619 m)   Wt 169 lb (76.7 kg)   LMP 06/25/2016   BMI 29.24 kg/m  CONSTITUTIONAL: Well-developed, well-nourished female in no acute distress.  EYES: EOM intact ENT: Normocephalic CARDIOVASCULAR: Regular rate .  RESPIRATORY: Normal rate. MUSCULOSKELETAL: Normal ROM, strength equal bilaterally SKIN: Warm, dry without erythema  NEUROLOGICAL: Alert, oriented, CN II-XII grossly intact, Appropriate balance PSYCH: Normal behavior, mood   Assessment & Plan:  Assessment: 1. Migraine with aura and without status migrainosus, not intractable   2. Headache associated with hormonal factors   3. Muscle  spasm   no improvement in above dx  Plan: Increase Topamax to 200mg  daily for migraine prevention Trial Frovatriptan for the week of menstrual migraine.  Do not use within 24 hours of Imitrex Imitrex for other migraine Flexeril for muscle spasm Follow-up in 3-4 months or sooner PRN  Bertram Denver, PA-C 07/14/2016 10:31 AM

## 2016-07-19 ENCOUNTER — Other Ambulatory Visit: Payer: Self-pay | Admitting: Physician Assistant

## 2016-07-25 ENCOUNTER — Encounter: Payer: Self-pay | Admitting: Obstetrics & Gynecology

## 2016-07-25 ENCOUNTER — Ambulatory Visit (INDEPENDENT_AMBULATORY_CARE_PROVIDER_SITE_OTHER): Payer: Medicaid Other | Admitting: Obstetrics & Gynecology

## 2016-07-25 ENCOUNTER — Encounter (HOSPITAL_COMMUNITY): Payer: Self-pay

## 2016-07-25 VITALS — BP 125/89 | HR 95 | Wt 169.8 lb

## 2016-07-25 DIAGNOSIS — Z3009 Encounter for other general counseling and advice on contraception: Secondary | ICD-10-CM | POA: Diagnosis not present

## 2016-07-25 MED ORDER — FLUCONAZOLE 150 MG PO TABS: 150.0000 mg | ORAL_TABLET | Freq: Every day | ORAL | 2 refills | Status: DC

## 2016-07-25 NOTE — Patient Instructions (Signed)
Laparoscopic Tubal Ligation Laparoscopic tubal ligation is a procedure to close the fallopian tubes. This is done so that you cannot get pregnant. When the fallopian tubes are closed, the eggs that your ovaries release cannot enter the uterus, and sperm cannot reach the released eggs. A laparoscopic tubal ligation is sometimes called "getting your tubes tied." You should not have this procedure if you want to get pregnant someday or if you are unsure about having more children. Tell a health care provider about:  Any allergies you have.  All medicines you are taking, including vitamins, herbs, eye drops, creams, and over-the-counter medicines.  Any problems you or family members have had with anesthetic medicines.  Any blood disorders you have.  Any surgeries you have had.  Any medical conditions you have.  Whether you are pregnant or may be pregnant.  Any past pregnancies. What are the risks? Generally, this is a safe procedure. However, problems may occur, including:  Infection.  Bleeding.  Injury to surrounding organs.  Side effects from anesthetics.  Failure of the procedure. This procedure can increase your risk of a kind of pregnancy in which a fertilized egg attaches to the outside of the uterus (ectopic pregnancy). What happens before the procedure?  Ask your health care provider about:  Changing or stopping your regular medicines. This is especially important if you are taking diabetes medicines or blood thinners.  Taking medicines such as aspirin and ibuprofen. These medicines can thin your blood. Do not take these medicines before your procedure if your health care provider instructs you not to.  Follow instructions from your health care provider about eating and drinking restrictions.  Plan to have someone take you home after the procedure.  If you go home right after the procedure, plan to have someone with you for 24 hours. What happens during the  procedure?  You will be given one or more of the following:  A medicine to help you relax (sedative).  A medicine to numb the area (local anesthetic).  A medicine to make you fall asleep (general anesthetic).  A medicine that is injected into an area of your body to numb everything below the injection site (regional anesthetic).  An IV tube will be inserted into one of your veins. It will be used to give you medicines and fluids during the procedure.  Your bladder may be emptied with a small tube (catheter).  If you have been given a general anesthetic, a tube will be put down your throat to help you breathe.  Two small cuts (incisions) will be made in your lower abdomen and near your belly button.  Your abdomen will be inflated with a gas. This will let the surgeon see better and will give the surgeon room to work.  A thin, lighted tube (laparoscope) with a camera attached will be inserted into your abdomen through one of the incisions. Small instruments will be inserted through the other incision.  The fallopian tubes will be blocked with a clip, ring, or clamp. A small portion in the center of each fallopian tube may be removed.  The gas will be released from the abdomen.  The incisions will be closed with stitches (sutures).  A bandage (dressing) will be placed over the incisions. The procedure may vary among health care providers and hospitals. What happens after the procedure?  Your blood pressure, heart rate, breathing rate, and blood oxygen level will be monitored often until the medicines you were given have worn off.  You will be given medicine to help with pain, nausea, and vomiting as needed. This information is not intended to replace advice given to you by your health care provider. Make sure you discuss any questions you have with your health care provider. Document Released: 07/24/2000 Document Revised: 09/23/2015 Document Reviewed: 03/28/2015 Elsevier  Interactive Patient Education  2017 [employer].

## 2016-07-25 NOTE — Progress Notes (Signed)
Patient presents for Tubal Consult

## 2016-07-25 NOTE — Progress Notes (Signed)
36 y.o. [id number] with undesired fertility desires permanent sterilization. Risks and benefits of laparoscopic tubal sterilization procedure was discussed with the patient including permanence of method, bleeding, infection, injury to surrounding organs, anesthesia and need for additional procedures. Risk failure of 0.5-1% with increased risk of ectopic gestation if pregnancy occurs was also discussed with patient. Patient verbalized understanding and all questions were answered. Post-sterilization pain and menstrual disorder also discussed.   The risks of surgery were discussed in detail with the patient including but not limited to: bleeding which may require transfusion or reoperation; infection which may require prolonged hospitalization or re-hospitalization and antibiotic therapy; injury to bowel, bladder, ureters and major vessels or other surrounding organs; need for additional procedures including laparotomy; thromboembolic phenomenon, incisional problems and other postoperative or anesthesia complications.  Patient was told that the likelihood that her condition and symptoms will be treated effectively with this surgical management was very high; the postoperative expectations were also discussed in detail. The patient also understands the alternative treatment options which were discussed in full. All questions were answered.  She was told that she will be contacted by our surgical scheduler regarding the time and date of her surgery; routine preoperative instructions of having nothing to eat or drink after midnight on the day prior to surgery and also coming to the hospital 1.5 hours prior to her time of surgery were also emphasized.  She was told she may be called for a preoperative appointment about a week prior to surgery and will be given further preoperative instructions at that visit. Printed patient education handouts about the procedure were given to the patient to review at home.   Currie ParisJames G.  Debroah LoopArnold MD 07/25/2016

## 2016-07-26 ENCOUNTER — Other Ambulatory Visit: Payer: Self-pay | Admitting: Obstetrics & Gynecology

## 2016-08-23 ENCOUNTER — Other Ambulatory Visit: Payer: Self-pay | Admitting: Physician Assistant

## 2016-08-23 DIAGNOSIS — G43109 Migraine with aura, not intractable, without status migrainosus: Secondary | ICD-10-CM

## 2016-08-24 NOTE — Patient Instructions (Addendum)
Your procedure is scheduled on:  Thursday, May 10  Enter through the Main Entrance of [facility] at: 2 pm   Pick up the phone at the desk and dial [phone].  Call this number if you have problems the morning of surgery: [phone].  Remember: Do NOT eat food after midnight Wednesday Do NOT drink clear liquids (including water) after:  9:30 am Thursday, day of surgery. Take these medicines the morning of surgery with a SIP OF WATER: claritin if needed  Do NOT wear jewelry (body piercing), metal hair clips/bobby pins, make-up, or nail polish. Do NOT wear lotions, powders, or perfumes.  You may wear deoderant. Do NOT shave for 48 hours prior to surgery. Do NOT bring valuables to the hospital.  . Have a responsible adult drive you home and stay with you for 24 hours after your procedure.  Home with husband [name] cell [phone]

## 2016-08-28 ENCOUNTER — Encounter (HOSPITAL_COMMUNITY): Payer: Self-pay

## 2016-08-28 ENCOUNTER — Encounter (HOSPITAL_COMMUNITY)
Admission: RE | Admit: 2016-08-28 | Discharge: 2016-08-28 | Disposition: A | Discharge status: Home / Self Care | Payer: Medicaid Other | Source: Ambulatory Visit | Attending: Obstetrics & Gynecology | Admitting: Obstetrics & Gynecology

## 2016-08-28 DIAGNOSIS — Z01812 Encounter for preprocedural laboratory examination: Secondary | ICD-10-CM | POA: Diagnosis not present

## 2016-08-28 HISTORY — DX: Anemia, unspecified: D64.9

## 2016-08-28 HISTORY — DX: Anxiety disorder, unspecified: F41.9

## 2016-08-28 HISTORY — DX: Other seasonal allergic rhinitis: J30.2

## 2016-08-28 LAB — CBC
HEMATOCRIT: 40.7 % (ref 36.0–46.0)
HEMOGLOBIN: 14.4 g/dL (ref 12.0–15.0)
MCH: 30.1 pg (ref 26.0–34.0)
MCHC: 35.4 g/dL (ref 30.0–36.0)
MCV: 85.1 fL (ref 78.0–100.0)
PLATELETS: 244 10*3/uL (ref 150–400)
RBC: 4.78 MIL/uL (ref 3.87–5.11)
RDW: 12.4 % (ref 11.5–15.5)
WBC: 8.7 10*3/uL (ref 4.0–10.5)

## 2016-08-28 LAB — TYPE AND SCREEN
ABO/RH(D): A POS
Antibody Screen: NEGATIVE

## 2016-08-28 LAB — ABO/RH: ABO/RH(D): A POS

## 2016-08-29 ENCOUNTER — Encounter (HOSPITAL_COMMUNITY): Payer: Self-pay | Admitting: Family Medicine

## 2016-08-29 ENCOUNTER — Ambulatory Visit (HOSPITAL_COMMUNITY)
Admission: EM | Admit: 2016-08-29 | Discharge: 2016-08-29 | Disposition: A | Discharge status: Home / Self Care | Payer: Medicaid Other | Attending: Family Medicine | Admitting: Family Medicine

## 2016-08-29 DIAGNOSIS — Z043 Encounter for examination and observation following other accident: Secondary | ICD-10-CM | POA: Diagnosis not present

## 2016-08-29 DIAGNOSIS — Z041 Encounter for examination and observation following transport accident: Secondary | ICD-10-CM

## 2016-08-29 DIAGNOSIS — M5489 Other dorsalgia: Secondary | ICD-10-CM | POA: Diagnosis not present

## 2016-08-29 DIAGNOSIS — M62838 Other muscle spasm: Secondary | ICD-10-CM | POA: Diagnosis not present

## 2016-08-29 DIAGNOSIS — M79602 Pain in left arm: Secondary | ICD-10-CM
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
## 2016-08-29 NOTE — Discharge Instructions (Signed)
Continue using the heating pad, muscle relaxer, and ibuprofen as directed. This will take weeks to completely resolve, but you should start feeling better slowly sooner. Continue moving your shoulder to prevent adhesive capsulitis (frozen shoulder), and return for care if symptoms worsen. This should have no impact on your surgery.

## 2016-08-30 NOTE — Telephone Encounter (Signed)
Received rx refill request for Frova from pharmacy, refill sent to pharmacy.

## 2016-09-05 ENCOUNTER — Ambulatory Visit (HOSPITAL_COMMUNITY)
Admission: EM | Admit: 2016-09-05 | Discharge: 2016-09-05 | Disposition: A | Discharge status: Home / Self Care | Payer: Medicaid Other | Attending: Emergency Medicine | Admitting: Emergency Medicine

## 2016-09-05 ENCOUNTER — Ambulatory Visit (INDEPENDENT_AMBULATORY_CARE_PROVIDER_SITE_OTHER): Payer: Medicaid Other

## 2016-09-05 ENCOUNTER — Encounter (HOSPITAL_COMMUNITY): Payer: Self-pay | Admitting: *Deleted

## 2016-09-05 DIAGNOSIS — M62838 Other muscle spasm: Secondary | ICD-10-CM | POA: Diagnosis present

## 2016-09-05 DIAGNOSIS — M542 Cervicalgia: Secondary | ICD-10-CM | POA: Diagnosis not present
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
## 2016-09-05 NOTE — Discharge Instructions (Addendum)
Recommend increasing flexeril to every 8/12 hours to assist with muscle spasm relief. Your xray were negative in office today. Continue current medication regimine. Follow up with your insurance provider/pcp for neurolgy referral/PT referral. Return to UC as needed.
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
## 2016-09-07 ENCOUNTER — Encounter (HOSPITAL_COMMUNITY): Payer: Self-pay

## 2016-09-07 ENCOUNTER — Ambulatory Visit (HOSPITAL_COMMUNITY): Payer: Medicaid Other | Admitting: Anesthesiology

## 2016-09-07 ENCOUNTER — Encounter (HOSPITAL_COMMUNITY)
Admission: RE | Disposition: A | Discharge status: Home / Self Care | Payer: Self-pay | Source: Ambulatory Visit | Attending: Obstetrics & Gynecology

## 2016-09-07 ENCOUNTER — Encounter (HOSPITAL_COMMUNITY): Payer: Self-pay | Admitting: *Deleted

## 2016-09-07 ENCOUNTER — Ambulatory Visit (HOSPITAL_COMMUNITY)
Admission: RE | Admit: 2016-09-07 | Discharge: 2016-09-07 | Disposition: A | Discharge status: Home / Self Care | Payer: Medicaid Other | Source: Ambulatory Visit | Attending: Obstetrics & Gynecology | Admitting: Obstetrics & Gynecology

## 2016-09-07 DIAGNOSIS — Z885 Allergy status to narcotic agent status: Secondary | ICD-10-CM | POA: Insufficient documentation

## 2016-09-07 DIAGNOSIS — Z302 Encounter for sterilization: Secondary | ICD-10-CM | POA: Diagnosis present

## 2016-09-07 DIAGNOSIS — F17210 Nicotine dependence, cigarettes, uncomplicated: Secondary | ICD-10-CM | POA: Diagnosis not present

## 2016-09-07 DIAGNOSIS — Z79899 Other long term (current) drug therapy: Secondary | ICD-10-CM | POA: Diagnosis not present

## 2016-09-07 DIAGNOSIS — Z30432 Encounter for removal of intrauterine contraceptive device: Secondary | ICD-10-CM | POA: Diagnosis not present

## 2016-09-07 DIAGNOSIS — G43909 Migraine, unspecified, not intractable, without status migrainosus: Secondary | ICD-10-CM | POA: Diagnosis not present

## 2016-09-07 DIAGNOSIS — J302 Other seasonal allergic rhinitis: Secondary | ICD-10-CM | POA: Diagnosis not present

## 2016-09-07 HISTORY — PX: LAPAROSCOPIC TUBAL LIGATION: SHX1937

## 2016-09-07 HISTORY — PX: IUD REMOVAL: SHX5392

## 2016-09-07 LAB — PREGNANCY, URINE: Preg Test, Ur: NEGATIVE

## 2016-09-07 SURGERY — LIGATION, FALLOPIAN TUBE, LAPAROSCOPIC
Anesthesia: General | Site: Vagina

## 2016-09-07 MED ORDER — IBUPROFEN 600 MG PO TABS: 600.0000 mg | ORAL_TABLET | Freq: Four times a day (QID) | ORAL | 0 refills | Status: DC | PRN

## 2016-09-07 MED ORDER — HYDROMORPHONE HCL 1 MG/ML IJ SOLN
0.5000 mg | INTRAMUSCULAR | Status: DC | PRN
  Administered 2016-09-07 (×2): 0.5 mg via INTRAVENOUS

## 2016-09-07 MED ORDER — ROCURONIUM BROMIDE 100 MG/10ML IV SOLN
INTRAVENOUS | Status: DC | PRN
  Administered 2016-09-07: 35 mg via INTRAVENOUS

## 2016-09-07 MED ORDER — HYDROMORPHONE HCL 1 MG/ML IJ SOLN
0.2500 mg | INTRAMUSCULAR | Status: DC | PRN
  Administered 2016-09-07: 0.5 mg via INTRAVENOUS

## 2016-09-07 MED ORDER — FENTANYL CITRATE (PF) 100 MCG/2ML IJ SOLN
INTRAMUSCULAR | Status: DC | PRN
  Administered 2016-09-07: 100 ug via INTRAVENOUS

## 2016-09-07 MED ORDER — SCOPOLAMINE 1 MG/3DAYS TD PT72
MEDICATED_PATCH | TRANSDERMAL | Status: AC
  Administered 2016-09-07: 1.5 mg via TRANSDERMAL
  Filled 2016-09-07: qty 1

## 2016-09-07 MED ORDER — ONDANSETRON HCL 4 MG/2ML IJ SOLN
INTRAMUSCULAR | Status: DC | PRN
  Administered 2016-09-07: 4 mg via INTRAVENOUS

## 2016-09-07 MED ORDER — PROPOFOL 10 MG/ML IV BOLUS
INTRAVENOUS | Status: AC
  Filled 2016-09-07: qty 20

## 2016-09-07 MED ORDER — SCOPOLAMINE 1 MG/3DAYS TD PT72
1.0000 | MEDICATED_PATCH | Freq: Once | TRANSDERMAL | Status: DC
  Administered 2016-09-07: 1.5 mg via TRANSDERMAL

## 2016-09-07 MED ORDER — DEXAMETHASONE SODIUM PHOSPHATE 4 MG/ML IJ SOLN
INTRAMUSCULAR | Status: AC
  Filled 2016-09-07: qty 1

## 2016-09-07 MED ORDER — MIDAZOLAM HCL 5 MG/5ML IJ SOLN
INTRAMUSCULAR | Status: DC | PRN
  Administered 2016-09-07: 2 mg via INTRAVENOUS

## 2016-09-07 MED ORDER — BUPIVACAINE HCL (PF) 0.25 % IJ SOLN
INTRAMUSCULAR | Status: DC | PRN
  Administered 2016-09-07: 10 mL
  Administered 2016-09-07: 6 mL

## 2016-09-07 MED ORDER — FENTANYL CITRATE (PF) 100 MCG/2ML IJ SOLN
INTRAMUSCULAR | Status: AC
Start: 2016-09-07 — End: ?
  Filled 2016-09-07: qty 2

## 2016-09-07 MED ORDER — DEXAMETHASONE SODIUM PHOSPHATE 10 MG/ML IJ SOLN
INTRAMUSCULAR | Status: DC | PRN
  Administered 2016-09-07: 4 mg via INTRAVENOUS

## 2016-09-07 MED ORDER — LACTATED RINGERS IV SOLN
INTRAVENOUS | Status: DC
  Administered 2016-09-07 (×2): via INTRAVENOUS

## 2016-09-07 MED ORDER — ROCURONIUM BROMIDE 100 MG/10ML IV SOLN
INTRAVENOUS | Status: AC
  Filled 2016-09-07: qty 1

## 2016-09-07 MED ORDER — PROPOFOL 10 MG/ML IV BOLUS
INTRAVENOUS | Status: DC | PRN
  Administered 2016-09-07: 200 mg via INTRAVENOUS

## 2016-09-07 MED ORDER — BUPIVACAINE HCL (PF) 0.25 % IJ SOLN
INTRAMUSCULAR | Status: AC
  Filled 2016-09-07: qty 30

## 2016-09-07 MED ORDER — LIDOCAINE HCL (CARDIAC) 20 MG/ML IV SOLN
INTRAVENOUS | Status: DC | PRN
  Administered 2016-09-07 (×2): 50 mg via INTRAVENOUS

## 2016-09-07 MED ORDER — HYDROMORPHONE HCL 1 MG/ML IJ SOLN
INTRAMUSCULAR | Status: AC
  Administered 2016-09-07: 0.5 mg via INTRAVENOUS
  Filled 2016-09-07: qty 1

## 2016-09-07 MED ORDER — SUGAMMADEX SODIUM 200 MG/2ML IV SOLN
INTRAVENOUS | Status: DC | PRN
  Administered 2016-09-07: 150 mg via INTRAVENOUS

## 2016-09-07 MED ORDER — KETOROLAC TROMETHAMINE 30 MG/ML IJ SOLN
INTRAMUSCULAR | Status: DC | PRN
  Administered 2016-09-07: 30 mg via INTRAVENOUS

## 2016-09-07 MED ORDER — PROMETHAZINE HCL 25 MG/ML IJ SOLN: 6.2500 mg | INTRAMUSCULAR | Status: DC | PRN

## 2016-09-07 MED ORDER — LIDOCAINE HCL (CARDIAC) 20 MG/ML IV SOLN
INTRAVENOUS | Status: AC
  Filled 2016-09-07: qty 5

## 2016-09-07 MED ORDER — MIDAZOLAM HCL 2 MG/2ML IJ SOLN
INTRAMUSCULAR | Status: AC
  Filled 2016-09-07: qty 2

## 2016-09-07 MED ORDER — ONDANSETRON HCL 4 MG/2ML IJ SOLN
INTRAMUSCULAR | Status: AC
  Filled 2016-09-07: qty 2

## 2016-09-07 SURGICAL SUPPLY — 18 items
CATH ROBINSON RED A/P 16FR (CATHETERS) ×3 IMPLANT
CLIP FILSHIE TUBAL LIGA STRL (Clip) ×3 IMPLANT
CLOTH BEACON ORANGE TIMEOUT ST (SAFETY) ×3 IMPLANT
DERMABOND ADVANCED (GAUZE/BANDAGES/DRESSINGS) ×1
DERMABOND ADVANCED .7 DNX12 (GAUZE/BANDAGES/DRESSINGS) ×2 IMPLANT
DRSG OPSITE POSTOP 3X4 (GAUZE/BANDAGES/DRESSINGS) ×3 IMPLANT
DURAPREP 26ML APPLICATOR (WOUND CARE) ×3 IMPLANT
GLOVE BIO SURGEON STRL SZ 6.5 (GLOVE) ×3 IMPLANT
GLOVE BIOGEL PI IND STRL 7.0 (GLOVE) ×8 IMPLANT
GLOVE BIOGEL PI INDICATOR 7.0 (GLOVE) ×4
GOWN STRL REUS W/TWL LRG LVL3 (GOWN DISPOSABLE) ×6 IMPLANT
NEEDLE INSUFFLATION 120MM (ENDOMECHANICALS) ×3 IMPLANT
PACK LAPAROSCOPY BASIN (CUSTOM PROCEDURE TRAY) ×3 IMPLANT
SUT VICRYL 0 UR6 27IN ABS (SUTURE) ×3 IMPLANT
SUT VICRYL 4-0 PS2 18IN ABS (SUTURE) ×3 IMPLANT
TOWEL OR 17X24 6PK STRL BLUE (TOWEL DISPOSABLE) ×6 IMPLANT
TROCAR XCEL DIL TIP R 11M (ENDOMECHANICALS) ×3 IMPLANT
WARMER LAPAROSCOPE (MISCELLANEOUS) ×3 IMPLANT
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
## 2016-09-07 NOTE — Anesthesia Postprocedure Evaluation (Addendum)
Anesthesia Post Note  Patient: Laurey MoraleJennifer Straw  Procedure(s) Performed: Procedure(s) (LRB): LAPAROSCOPIC TUBAL LIGATION (Bilateral) INTRAUTERINE DEVICE (IUD) REMOVAL (N/A)  Patient location during evaluation: PACU Anesthesia Type: General Level of consciousness: sedated Pain management: pain level controlled Vital Signs Assessment: post-procedure vital signs reviewed and stable Respiratory status: spontaneous breathing and respiratory function stable Cardiovascular status: stable Anesthetic complications: no        Last Vitals:  Vitals:   09/07/16 1330 09/07/16 1345  BP: 131/84 (!) 124/95  Pulse: 80 85  Resp: 16 16  Temp:  36.4 C    Last Pain:  Vitals:   09/07/16 1345  TempSrc:   PainSc: 3    Pain Goal: Patients Stated Pain Goal: 3 (09/07/16 1345)               Janelie Goltz DANIEL

## 2016-09-07 NOTE — Discharge Instructions (Signed)
Laparoscopic Tubal Ligation, Care After Refer to this sheet in the next few weeks. These instructions provide you with information about caring for yourself after your procedure. Your health care provider may also give you more specific instructions. Your treatment has been planned according to current medical practices, but problems sometimes occur. Call your health care provider if you have any problems or questions after your procedure. What can I expect after the procedure? After the procedure, it is common to have:  A sore throat.  Discomfort in your shoulder.  Mild discomfort or cramping in your abdomen.  Gas pains.  Pain or soreness in the area where the surgical cut (incision) was made.  A bloated feeling.  Tiredness.  Nausea.  Vomiting. Follow these instructions at home: Medicines   Take over-the-counter and prescription medicines only as told by your health care provider.  Do not take aspirin because it can cause bleeding.  Do not drive or operate heavy machinery while taking prescription pain medicine. Activity   Rest for the rest of the day.  Return to your normal activities as told by your health care provider. Ask your health care provider what activities are safe for you. Incision care    Follow instructions from your health care provider about how to take care of your incision. Make sure you:  Wash your hands with soap and water before you change your bandage (dressing). If soap and water are not available, use hand sanitizer.  Change your dressing as told by your health care provider.  Leave stitches (sutures) in place. They may need to stay in place for 2 weeks or longer.  Check your incision area every day for signs of infection. Check for:  More redness, swelling, or pain.  More fluid or blood.  Warmth.  Pus or a bad smell. Other Instructions   Do not take baths, swim, or use a hot tub until your health care provider approves. You may take  showers.  Keep all follow-up visits as told by your health care provider. This is important.  Have someone help you with your daily household tasks for the first few days. Contact a health care provider if:  You have more redness, swelling, or pain around your incision.  Your incision feels warm to the touch.  You have pus or a bad smell coming from your incision.  The edges of your incision break open after the sutures have been removed.  Your pain does not improve after 2-3 days.  You have a rash.  You repeatedly become dizzy or light-headed.  Your pain medicine is not helping.  You are constipated. Get help right away if:  You have a fever.  You faint.  You have increasing pain in your abdomen.  You have severe pain in one or both of your shoulders.  You have fluid or blood coming from your sutures or from your vagina.  You have shortness of breath or difficulty breathing.  You have chest pain or leg pain.  You have ongoing nausea, vomiting, or diarrhea. This information is not intended to replace advice given to you by your health care provider. Make sure you discuss any questions you have with your health care provider. Document Released: 11/04/2004 Document Revised: 09/20/2015 Document Reviewed: 03/28/2015 Elsevier Interactive Patient Education  2017 [place] [facility] Instructions  Activity: Get plenty of rest for the remainder of the day. A responsible individual must stay with you for 24 hours following the  procedure.  For the next 24 hours, DO NOT: -Drive a car -Advertising copywriterperate machinery -Drink alcoholic beverages -Take any medication unless instructed by your physician -Make any legal decisions or sign important papers.  Meals: Start with liquid foods such as gelatin or soup. Progress to regular foods as tolerated. Avoid greasy, spicy, heavy foods. If nausea and/or vomiting occur, drink only clear liquids until the nausea and/or  vomiting subsides. Call your physician if vomiting continues.  Special Instructions/Symptoms: Your throat may feel dry or sore from the anesthesia or the breathing tube placed in your throat during surgery. If this causes discomfort, gargle with warm salt water. The discomfort should disappear within 24 hours.  If you had a scopolamine patch placed behind your ear for the management of post- operative nausea and/or vomiting:  1. The medication in the patch is effective for 72 hours, after which it should be removed.  Wrap patch in a tissue and discard in the trash. Wash hands thoroughly with soap and water. 2. You may remove the patch earlier than 72 hours if you experience unpleasant side effects which may include dry mouth, dizziness or visual disturbances. 3. Avoid touching the patch. Wash your hands with soap and water after contact with the patch.

## 2016-09-07 NOTE — Anesthesia Preprocedure Evaluation (Signed)
Anesthesia Evaluation  Patient identified by MRN, date of birth, ID band Patient awake    Reviewed: Allergy & Precautions, NPO status , Patient's Chart, lab work & pertinent test results  History of Anesthesia Complications Negative for: history of anesthetic complications  Airway Mallampati: II  TM Distance: >3 FB Neck ROM: Full    Dental no notable dental hx. (+) Dental Advisory Given   Pulmonary Current Smoker,    Pulmonary exam normal        Cardiovascular negative cardio ROS Normal cardiovascular exam     Neuro/Psych  Headaches, Anxiety negative psych ROS   GI/Hepatic negative GI ROS, Neg liver ROS,   Endo/Other  negative endocrine ROS  Renal/GU negative Renal ROS  negative genitourinary   Musculoskeletal negative musculoskeletal ROS (+)   Abdominal   Peds negative pediatric ROS (+)  Hematology negative hematology ROS (+)   Anesthesia Other Findings   Reproductive/Obstetrics negative OB ROS                             Anesthesia Physical Anesthesia Plan  ASA: II  Anesthesia Plan: General   Post-op Pain Management:    Induction: Intravenous  Airway Management Planned: Oral ETT  Additional Equipment:   Intra-op Plan:   Post-operative Plan: Extubation in OR  Informed Consent: I have reviewed the patients History and Physical, chart, labs and discussed the procedure including the risks, benefits and alternatives for the proposed anesthesia with the patient or authorized representative who has indicated his/her understanding and acceptance.   Dental advisory given  Plan Discussed with: CRNA and Anesthesiologist  Anesthesia Plan Comments:         Anesthesia Quick Evaluation

## 2016-09-07 NOTE — Op Note (Signed)
[name] 09/07/2016  PREOPERATIVE DIAGNOSIS:  Undesired fertility  POSTOPERATIVE DIAGNOSIS:  Undesired fertility  PROCEDURE:  Laparoscopic Bilateral Tubal Sterilization using Filshie Clips   SURGEON: Adam PhenixArnold, Gracelee Stemmler G, MD   ANESTHESIA:  General endotracheal  COMPLICATIONS:  None immediate.  ESTIMATED BLOOD LOSS:  Less than 20 ml.  FLUIDS: 1000 ml LR.  URINE OUTPUT:  50 ml of clear urine.  INDICATIONS: 36 y.o. [id number]  with undesired fertility, desires permanent sterilization. She consents to sterilization using Filshie clips. Other reversible forms of contraception were discussed with patient; she declines all other modalities.  Risks of procedure discussed with patient including permanence of method, bleeding, infection, injury to surrounding organs and need for additional procedures including laparotomy, risk of regret.  Failure risk of 0.5-1% with increased risk of ectopic gestation if pregnancy occurs was also discussed with patient. Post procedure pain and menstrual problem can occur.      FINDINGS:  Normal uterus, tubes, and ovaries.  TECHNIQUE:  The patient was taken to the operating room where general anesthesia was obtained without difficulty.  She was then placed in the dorsal lithotomy position and prepared and draped in sterile fashion.  After an adequate timeout was performed, a bivalved speculum was then placed in the patient's vagina, and the anterior lip of cervix grasped with the single-tooth tenaculum.  The uterine manipulator was then advanced into the uterus.  The speculum was removed from the vagina.  Attention was then turned to the patient's abdomen where a 11-mm skin incision was made in the umbilical fold.  The Optiview 11-mm trocar and sleeve were then advanced without difficulty with the laparoscope under direct visualization into the abdomen.  The abdomen was then insufflated with carbon dioxide gas and adequate pneumoperitoneum was obtained.  A survey of  the patient's pelvis and abdomen revealed entirely normal anatomy.  The fallopian tubes were observed and found to be normal in appearance. The Filshie clip applicator was placed through the operative port, and a Filshie clip was placed on the right fallopian tube ,about 2 cm from the cornual attachment, with care given to incorporate the underlying mesosalpinx.  A similar process was carried out on the contralateral side allowing for bilateral tubal sterilization.   Good hemostasis was noted overall.  Local analgesia was drizzled on both operative sites.The instruments were then removed from the patient's abdomen and the fascial incision was repaired with 0 Vicryl, and the skin was closed with Dermabond.  The uterine manipulator and the tenaculum were removed from the vagina without complications. The patient tolerated the procedure well.  Sponge, lap, and needle counts were correct times two.  The patient was then taken to the recovery room awake, extubated and in stable condition.  Adam PhenixArnold, Eldar Robitaille G, MD 09/07/2016 12:43 PM

## 2016-09-07 NOTE — Transfer of Care (Signed)
Immediate Anesthesia Transfer of Care Note  Patient: [name]  Procedure(s) Performed: Procedure(s): LAPAROSCOPIC TUBAL LIGATION (Bilateral) INTRAUTERINE DEVICE (IUD) REMOVAL (N/A)  Patient Location: PACU  Anesthesia Type:General  Level of Consciousness: awake, alert  and oriented  Airway & Oxygen Therapy: Patient Spontanous Breathing and Patient connected to nasal cannula oxygen  Post-op Assessment: Report given to RN and Post -op Vital signs reviewed and stable  Post vital signs: Reviewed and stable  Last Vitals:  Vitals:   09/07/16 1033  BP: 114/77  Pulse: 83  Resp: 18  Temp: 36.9 C    Last Pain:  Vitals:   09/07/16 1033  TempSrc: Oral  PainSc: 0-No pain      Patients Stated Pain Goal: 3 (09/07/16 1033)  Complications: No apparent anesthesia complications

## 2016-09-07 NOTE — Anesthesia Procedure Notes (Signed)
Procedure Name: Intubation Date/Time: 09/07/2016 12:08 PM Performed by: Vernice Jefferson Pre-anesthesia Checklist: Patient identified, Emergency Drugs available, Suction available, Patient being monitored and Timeout performed Patient Re-evaluated:Patient Re-evaluated prior to inductionOxygen Delivery Method: Circle system utilized Preoxygenation: Pre-oxygenation with 100% oxygen Intubation Type: IV induction Ventilation: Mask ventilation without difficulty Laryngoscope Size: Mac and 3 Grade View: Grade II Tube type: Oral Tube size: 7.0 mm Number of attempts: 1 Airway Equipment and Method: Stylet Placement Confirmation: ETT inserted through vocal cords under direct vision,  positive ETCO2 and breath sounds checked- equal and bilateral Secured at: 21 cm Tube secured with: Tape Dental Injury: Teeth and Oropharynx as per pre-operative assessment

## 2016-09-08 ENCOUNTER — Encounter (HOSPITAL_COMMUNITY): Payer: Self-pay | Admitting: Obstetrics & Gynecology

## 2016-09-22 ENCOUNTER — Ambulatory Visit (INDEPENDENT_AMBULATORY_CARE_PROVIDER_SITE_OTHER): Payer: Medicaid Other | Admitting: Physician Assistant

## 2016-09-22 ENCOUNTER — Encounter: Payer: Self-pay | Admitting: Physician Assistant

## 2016-09-22 VITALS — BP 125/87 | HR 97 | Ht 63.0 in | Wt 168.0 lb

## 2016-09-22 DIAGNOSIS — G4489 Other headache syndrome: Secondary | ICD-10-CM

## 2016-09-22 DIAGNOSIS — M62838 Other muscle spasm: Secondary | ICD-10-CM

## 2016-09-22 DIAGNOSIS — M545 Low back pain: Secondary | ICD-10-CM

## 2016-09-22 DIAGNOSIS — G43109 Migraine with aura, not intractable, without status migrainosus: Secondary | ICD-10-CM

## 2016-09-22 DIAGNOSIS — M542 Cervicalgia: Secondary | ICD-10-CM

## 2016-09-22 MED ORDER — PROMETHAZINE HCL 12.5 MG PO TABS: 12.5000 mg | ORAL_TABLET | Freq: Four times a day (QID) | ORAL | 0 refills | Status: DC | PRN
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
## 2016-09-22 NOTE — Patient Instructions (Signed)
Muscle Cramps and Spasms  Muscle cramps and spasms occur when a muscle or muscles tighten and you have no control over this tightening (involuntary muscle contraction). They are a common problem and can develop in any muscle. The most common place is in the calf muscles of the leg. Muscle cramps and muscle spasms are both involuntary muscle contractions, but there are some differences between the two:  · Muscle cramps are painful. They come and go and may last a few seconds to 15 minutes. Muscle cramps are often more forceful and last longer than muscle spasms.  · Muscle spasms may or may not be painful. They may also last just a few seconds or much longer.    Certain medical conditions, such as diabetes or Parkinson disease, can make it more likely to develop cramps or spasms. However, cramps or spasms are usually not caused by a serious underlying problem. Common causes include:  · Overexertion.  · Overuse from repetitive motions, or doing the same thing over and over.  · Remaining in a certain position for a long period of time.  · Improper preparation, form, or technique while playing a sport or doing an activity.  · Dehydration.  · Injury.  · Side effects of some medicines.  · Abnormally low levels of the salts and ions in your blood (electrolytes), especially potassium and calcium. This could happen if you are taking water pills (diuretics) or if you are pregnant.    In many cases, the cause of muscle cramps or spasms is unknown.  Follow these instructions at home:  · Stay well hydrated. Drink enough fluid to keep your urine clear or pale yellow.  · Try massaging, stretching, and relaxing the affected muscle.  · If directed, apply heat to tight or tense muscles as often as told by your health care provider. Use the heat source that your health care provider recommends, such as a moist heat pack or a heating pad.  ? Place a towel between your skin and the heat source.  ? Leave the heat on for 20-30  minutes.  ? Remove the heat if your skin turns bright red. This is especially important if you are unable to feel pain, heat, or cold. You may have a greater risk of getting burned.  · If directed, put ice on the affected area. This may help if you are sore or have pain after a cramp or spasm.  ? Put ice in a plastic bag.  ? Place a towel between your skin and the bag.  ? Leave the ice on for 20 minutes, 2-3 times a day.  · Take over-the-counter and prescription medicines only as told by your health care provider.  · Pay attention to any changes in your symptoms.  Contact a health care provider if:  · Your cramps or spasms get more severe or happen more often.  · Your cramps or spasms do not improve over time.  This information is not intended to replace advice given to you by your health care provider. Make sure you discuss any questions you have with your health care provider.  Document Released: 10/07/2001 Document Revised: 05/19/2015 Document Reviewed: 01/19/2015  Elsevier Interactive Patient Education © 2017 [employer]

## 2016-09-30 NOTE — Addendum Note (Signed)
Addendum  created 09/30/16 0957 by Kaiesha Tonner, MD   Sign clinical note    

## 2016-10-05 ENCOUNTER — Ambulatory Visit (INDEPENDENT_AMBULATORY_CARE_PROVIDER_SITE_OTHER): Payer: Medicaid Other | Admitting: Obstetrics and Gynecology

## 2016-10-05 VITALS — BP 126/85 | HR 93 | Wt 165.0 lb

## 2016-10-05 DIAGNOSIS — Z9889 Other specified postprocedural states: Secondary | ICD-10-CM

## 2016-10-05 NOTE — Progress Notes (Signed)
36 yo [id number] here for post op check s/p LSC bilateral tubal ligation on 5/10. Patient reports no concerns. She denies fever, abnormal drainage from the incision. She denies any pain. She has return to her baseline activities.  Past Medical History:  Diagnosis Date  . Anemia   . Anxiety    no medication  . Kidney disease    one one kidney not functioning well per patient - age 36 yrs, no current problems  . Migraine   . Seasonal allergies   . SVD (spontaneous vaginal delivery)    x 4   Past Surgical History:  Procedure Laterality Date  . APPENDECTOMY    . EYE SURGERY Right    removed cyst bottom lid - age 36 yrs  . IUD REMOVAL N/A 09/07/2016   Procedure: INTRAUTERINE DEVICE (IUD) REMOVAL;  Surgeon: Adam PhenixArnold, James G, MD;  Location: [facility];  Service: Gynecology;  Laterality: N/A;  . LAPAROSCOPIC TUBAL LIGATION Bilateral 09/07/2016   Procedure: LAPAROSCOPIC TUBAL LIGATION;  Surgeon: Adam PhenixArnold, James G, MD;  Location: [facility];  Service: Gynecology;  Laterality: Bilateral;   Family History  Problem Relation Age of Onset  . Hypertension Mother   . Cervical cancer Mother        Precancerous   Social History  Substance Use Topics  . Smoking status: Current Every Day Smoker    Packs/day: 0.25    Years: 20.00    Types: Cigarettes  . Smokeless tobacco: Never Used  . Alcohol use Yes     Comment: occasional    ROS See pertinent in HPI   GENERAL: Well-developed, well-nourished female in no acute distress.  ABDOMEN: Soft, nontender, nondistended. No organomegaly. PELVIC: Not indicated EXTREMITIES: No cyanosis, clubbing, or edema, 2+ distal pulses.  A/P 36 yo s/p LSC BTL here for post op check -Patient is medically cleared to resume all activities of daily living - patient with normal pap smear 03/2016 - RTC prn

## 2016-10-12 ENCOUNTER — Ambulatory Visit (INDEPENDENT_AMBULATORY_CARE_PROVIDER_SITE_OTHER): Payer: Medicaid Other | Admitting: Neurology

## 2016-10-12 ENCOUNTER — Encounter: Payer: Self-pay | Admitting: Neurology

## 2016-10-12 VITALS — BP 115/76 | HR 76 | Ht 64.0 in | Wt 165.0 lb

## 2016-10-12 DIAGNOSIS — M542 Cervicalgia: Secondary | ICD-10-CM | POA: Diagnosis not present

## 2016-10-12 DIAGNOSIS — M5412 Radiculopathy, cervical region: Secondary | ICD-10-CM | POA: Diagnosis not present

## 2016-10-12 NOTE — Patient Instructions (Addendum)
You may have musculoskeletal pain. If you have a pinched nerve in the neck, you may benefit from seeing an orthopedic specialist.  Please establish care with a primary care physician.   To investigate things further, We will do a cervical spine (i.e. neck) MRI to look for degenerative changes and for pinched nerve type findings.   We will do an EMG and nerve conduction velocity test, which is an electrical nerve and muscle test, which we will schedule. We will call you with the results.   You can continue to use a head pad, the flexeril and tylenol.
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
## 2016-10-20 ENCOUNTER — Encounter: Payer: Self-pay | Admitting: Physician Assistant

## 2016-10-24 ENCOUNTER — Telehealth: Payer: Self-pay | Admitting: Neurology

## 2016-10-24 DIAGNOSIS — M542 Cervicalgia: Secondary | ICD-10-CM

## 2016-10-24 DIAGNOSIS — M5412 Radiculopathy, cervical region: Secondary | ICD-10-CM

## 2016-10-24 NOTE — Telephone Encounter (Signed)
This is a Dr. Frances [name] patient. [facility]. Medicaid did not approve it the phone number for the peer to peer is [phone] and the case number is [id number]. The phone number is [phone]. But medicaid did say they would approve it ft it is switch to a MRI Cervical spine wo contrast.

## 2016-10-24 NOTE — Telephone Encounter (Signed)
Per RAS, ok for MRI c-spine without contrast/fim

## 2016-10-24 NOTE — Telephone Encounter (Signed)
Thank you :)

## 2016-10-25 ENCOUNTER — Other Ambulatory Visit: Payer: Self-pay

## 2016-10-25 ENCOUNTER — Ambulatory Visit
Admission: RE | Admit: 2016-10-25 | Discharge: 2016-10-25 | Disposition: A | Discharge status: Home / Self Care | Payer: Medicaid Other | Source: Ambulatory Visit | Attending: Neurology | Admitting: Neurology

## 2016-10-25 DIAGNOSIS — M5412 Radiculopathy, cervical region: Secondary | ICD-10-CM | POA: Diagnosis not present

## 2016-10-25 DIAGNOSIS — M542 Cervicalgia: Secondary | ICD-10-CM

## 2016-10-26 ENCOUNTER — Ambulatory Visit (INDEPENDENT_AMBULATORY_CARE_PROVIDER_SITE_OTHER): Payer: Medicaid Other | Admitting: Diagnostic Neuroimaging

## 2016-10-26 ENCOUNTER — Ambulatory Visit (INDEPENDENT_AMBULATORY_CARE_PROVIDER_SITE_OTHER): Payer: Self-pay

## 2016-10-26 DIAGNOSIS — M5412 Radiculopathy, cervical region: Secondary | ICD-10-CM

## 2016-10-26 DIAGNOSIS — M542 Cervicalgia: Secondary | ICD-10-CM

## 2016-10-26 DIAGNOSIS — Z0289 Encounter for other administrative examinations: Secondary | ICD-10-CM

## 2016-10-27 NOTE — Procedures (Signed)
[facility]  NCS (NERVE CONDUCTION STUDY) WITH EMG (ELECTROMYOGRAPHY) REPORT   STUDY DATE: 10/26/16 PATIENT NAME: Laurey MoraleJennifer Boyden DOB: 1980-12-05 MRN: [id number]  ORDERING CLINICIAN: Huston FoleySaima Athar, MD PhD   TECHNOLOGIST: Charlesetta IvoryBeau Handy ELECTROMYOGRAPHER: Glenford BayleyVikram R. Davier Tramell, MD  CLINICAL INFORMATION: 36 year old female with neck pain and LEFT arm pain  FINDINGS: NERVE CONDUCTION STUDY: Left median and left ulnar motor responses were normal. Left ulnar F wave latencies normal.  Left radial, left median and left ulnar sensory responses are normal. Left median to ulnar transcarpal comparison is normal.   NEEDLE ELECTROMYOGRAPHY: Needle examination of left extremity deltoid, biceps, triceps, flexor carpi radialis, first dorsal interosseous and left cervical paraspinal muscles is normal.   IMPRESSION:  This is a normal study. No elective diagnostic evidence of large fiber neuropathy or LEFT cervical retinopathy at this time.    INTERPRETING PHYSICIAN:  Suanne MarkerVIKRAM R. Erroll Wilbourne, MD Certified in Neurology, Neurophysiology and Neuroimaging  [facility] [address], [address] [place], [place] [address] [phone]    Note that nerve conduction study was performed on LEFT upper extremity, but incorrectly labeled below.  MNC    Nerve / Sites Rec. Site Latency Ref. Amplitude Ref. Rel Amp Segments Distance Velocity Ref. Area    ms ms mV mV %  cm m/s m/s mVms  L Median - APB     Wrist APB 3.4 ?4.4 9.2 ?4.0 100 Wrist - APB 7   31.6     Upper arm APB 6.9  8.8  95.5 Upper arm - Wrist 20 56 ?49 29.9     Axilla APB 10.3  9.5  108 Axilla - Upper arm 18 53  28.2         Axilla - Wrist      L Ulnar - ADM     Wrist ADM 2.3 ?3.3 8.7 ?6.0 100 Wrist - ADM 7   27.1     B.Elbow ADM 5.9  8.6  98.4 B.Elbow - Wrist 19 52 ?49 26.9     A.Elbow ADM 7.6  8.1  94.2 A.Elbow - B.Elbow 9 56 ?49 25.5     Axilla ADM 10.4  7.2  88.9 Axilla - A.Elbow 15 53  22.4   Supraclav fossa ADM 12.6  7.2  100 Supraclav fossa - Axilla 12 55  21.5         A.Elbow - Wrist             SNC    Nerve / Sites Rec. Site Peak Lat Ref.  Amp Ref. Segments Distance Peak Diff Ref.    ms ms V V  cm ms ms  L Radial - Anatomical snuff box (Forearm)     Forearm Wrist 2.40 ?2.90 33 ?15 Forearm - Wrist 10    L Median, Ulnar - Transcarpal comparison     Median Palm Wrist 2.19 ?2.20 76 ?35 Median Palm - Wrist 8       Ulnar Palm Wrist 1.93 ?2.20 24 ?12 Ulnar Palm - Wrist 8          Median Palm - Ulnar Palm  0.3 ?0.4  L Median - Orthodromic (Dig II, Mid palm)     Dig II Wrist 3.02 ?3.40 18 ?10 Dig II - Wrist 13    L Ulnar - Orthodromic, (Dig V, Mid palm)     Dig V Wrist 2.60 ?3.10 11 ?5 Dig V - Wrist 11  F  Wave    Nerve F Lat Ref.   ms ms  L Ulnar - ADM 24.5 ?32.0       EMG full       EMG Summary Table    Spontaneous MUAP Recruitment  Muscle IA Fib PSW Fasc Other Amp Dur. Poly Pattern  L. Cervical paraspinals Normal None None None _______ Normal Normal Normal Normal  L. Deltoid Normal None None None _______ Normal Normal Normal Normal  L. Biceps brachii Normal None None None _______ Normal Normal Normal Normal  L. Triceps brachii Normal None None None _______ Normal Normal Normal Normal  L. Flexor carpi radialis Normal None None None _______ Normal Normal Normal Normal  L. First dorsal interosseous Normal None None None _______ Normal Normal Normal Normal

## 2016-10-30 NOTE — Progress Notes (Signed)
Please call and advise the patient that the recent EMG and nerve conduction velocity test, which is the electrical nerve and muscle test we we performed, was reported as within normal limits. We checked for abnormal electrical discharges in the muscles or nerves and the report suggested normal findings. No further action is required on this test at this time.  Also, her MRI neck wo contrast showed benign findings, no evidence of pinched nerve type findings.  She can follow up with PCP.  Thanks,  Huston FoleySaima Camara Rosander, MD, PhD

## 2016-10-31 ENCOUNTER — Telehealth: Payer: Self-pay

## 2016-10-31 NOTE — Telephone Encounter (Signed)
I called pt. I advised her that her recent EMG andNCV test was normal. I also advised pt that her MRI neck showed benign findings, and no evidence of pinched nerve type findings. I advised her that Dr. Frances FurbishAthar recommends that she follow up with her PCP. Pt verbalized understanding of results.  Pt is asking for a billing statement for these tests so she can send this to her insurance company. I advised her that I can send this question to our billing department for review. Pt verbalized understanding.

## 2016-10-31 NOTE — Telephone Encounter (Signed)
-----   Message from Huston FoleySaima Athar, MD sent at 10/30/2016  8:00 AM EDT ----- Please call and advise the patient that the recent EMG and nerve conduction velocity test, which is the electrical nerve and muscle test we we performed, was reported as within normal limits. We checked for abnormal electrical discharges in the muscles or nerves and the report suggested normal findings. No further action is required on this test at this time.  Also, her MRI neck wo contrast showed benign findings, no evidence of pinched nerve type findings.  She can follow up with PCP.  Thanks,  Huston FoleySaima Athar, MD, PhD

## 2016-11-14 ENCOUNTER — Other Ambulatory Visit: Payer: Self-pay | Admitting: Physician Assistant

## 2017-04-26 DIAGNOSIS — H60331 Swimmer's ear, right ear: Secondary | ICD-10-CM | POA: Diagnosis not present

## 2017-04-30 DIAGNOSIS — H60391 Other infective otitis externa, right ear: Secondary | ICD-10-CM | POA: Diagnosis not present
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
## 2017-07-20 ENCOUNTER — Encounter: Payer: Self-pay | Admitting: *Deleted

## 2017-08-02 ENCOUNTER — Ambulatory Visit (INDEPENDENT_AMBULATORY_CARE_PROVIDER_SITE_OTHER): Payer: BLUE CROSS/BLUE SHIELD | Admitting: Obstetrics and Gynecology

## 2017-08-02 ENCOUNTER — Encounter: Payer: Self-pay | Admitting: Obstetrics and Gynecology

## 2017-08-02 VITALS — BP 138/93 | HR 99 | Wt 174.0 lb

## 2017-08-02 DIAGNOSIS — Z124 Encounter for screening for malignant neoplasm of cervix: Secondary | ICD-10-CM | POA: Diagnosis not present

## 2017-08-02 DIAGNOSIS — R102 Pelvic and perineal pain: Secondary | ICD-10-CM

## 2017-08-02 DIAGNOSIS — Z01419 Encounter for gynecological examination (general) (routine) without abnormal findings: Secondary | ICD-10-CM

## 2017-08-02 DIAGNOSIS — Z1151 Encounter for screening for human papillomavirus (HPV): Secondary | ICD-10-CM | POA: Diagnosis not present

## 2017-08-02 LAB — POCT URINALYSIS DIPSTICK
Bilirubin, UA: NEGATIVE
Blood, UA: NEGATIVE
Glucose, UA: NEGATIVE
Ketones, UA: NEGATIVE
Leukocytes, UA: NEGATIVE
Nitrite, UA: NEGATIVE
Protein, UA: NEGATIVE
Spec Grav, UA: 1.02
Urobilinogen, UA: 0.2 U/dL
pH, UA: 6

## 2017-08-02 NOTE — Progress Notes (Signed)
Subjective:     [name] is a 37 y.o. female [phone] with LMP 07/19/2017 and BMI 29 and is here for a comprehensive physical exam. The patient reports onset of a sharp RLQ pain over the past 2 weeks. The pain is intermittent but present daily. She cannot identify any alleviating or triggering factors. Patient is sexually active using BTL for contraception. She reports a monthly period and denies any pelvic pain or abnormal discharge  Past Medical History:  Diagnosis Date  . Anemia   . Anxiety    no medication  . Kidney disease    one one kidney not functioning well per patient - age 83 yrs, no current problems  . Migraine   . Seasonal allergies   . SVD (spontaneous vaginal delivery)    x 4   Past Surgical History:  Procedure Laterality Date  . APPENDECTOMY    . EYE SURGERY Right    removed cyst bottom lid - age 63 yrs  . IUD REMOVAL N/A 09/07/2016   Procedure: INTRAUTERINE DEVICE (IUD) REMOVAL;  Surgeon: Adam Phenix, MD;  Location: [facility];  Service: Gynecology;  Laterality: N/A;  . LAPAROSCOPIC TUBAL LIGATION Bilateral 09/07/2016   Procedure: LAPAROSCOPIC TUBAL LIGATION;  Surgeon: Adam Phenix, MD;  Location: [facility];  Service: Gynecology;  Laterality: Bilateral;   Family History  Problem Relation Age of Onset  . Hypertension Mother   . Cervical cancer Mother        Precancerous    Social History   Socioeconomic History  . Marital status: Married    Spouse name: Not on file  . Number of children: Not on file  . Years of education: Not on file  . Highest education level: Not on file  Occupational History  . Not on file  Social Needs  . Financial resource strain: Not on file  . Food insecurity:    Worry: Not on file    Inability: Not on file  . Transportation needs:    Medical: Not on file    Non-medical: Not on file  Tobacco Use  . Smoking status: Current Every Day Smoker    Packs/day: 0.25    Years: 20.00    Pack years: 5.00    Types: Cigarettes  .  Smokeless tobacco: Never Used  Substance and Sexual Activity  . Alcohol use: Yes    Comment: occasional   . Drug use: No  . Sexual activity: Yes    Partners: Male    Birth control/protection: Surgical    Comment: BTL   Lifestyle  . Physical activity:    Days per week: Not on file    Minutes per session: Not on file  . Stress: Not on file  Relationships  . Social connections:    Talks on phone: Not on file    Gets together: Not on file    Attends religious service: Not on file    Active member of club or organization: Not on file    Attends meetings of clubs or organizations: Not on file    Relationship status: Not on file  . Intimate partner violence:    Fear of current or ex partner: Not on file    Emotionally abused: Not on file    Physically abused: Not on file    Forced sexual activity: Not on file  Other Topics Concern  . Not on file  Social History Narrative  . Not on file   Health Maintenance  Topic Date Due  .  TETANUS/TDAP  03/26/2000  . INFLUENZA VACCINE  11/29/2017  . PAP SMEAR  04/14/2019  . HIV Screening  Completed       Review of Systems Pertinent items are noted in HPI.   Objective:  Blood pressure (!) 138/93, pulse 99, weight 174 lb (78.9 kg), last menstrual period 07/19/2017.      GENERAL: Well-developed, well-nourished female in no acute distress.  HEENT: Normocephalic, atraumatic. Sclerae anicteric.  NECK: Supple. Normal thyroid.  LUNGS: Clear to auscultation bilaterally.  HEART: Regular rate and rhythm. BREASTS: Symmetric in size. No palpable masses or lymphadenopathy, skin changes, or nipple drainage. ABDOMEN: Soft, nontender, nondistended. No organomegaly. PELVIC: Normal external female genitalia. Vagina is pink and rugated.  Normal discharge. Normal appearing cervix. Uterus is normal in size. No adnexal mass or tenderness. EXTREMITIES: No cyanosis, clubbing, or edema, 2+ distal pulses.    Assessment:    Healthy female exam.       Plan:    Pap smear collected Pelvic ultrasound ordered Patient will be contacted with any abnormal results See After Visit Summary for Counseling Recommendations

## 2017-08-02 NOTE — Progress Notes (Signed)
Patient presents for her Annual Exam today.   LMP; 07/19/17 Last pap:04/13/2016 Contraception: BTL  STD Screening: Declines   CC: lower pelvic pain on the right side Worse with intercourse. Pt states she has hx of hx no recent U/S. UA: NEGATIVE    Pt gets monthly injections for HA's.

## 2017-08-06 LAB — CYTOLOGY - PAP
DIAGNOSIS: NEGATIVE
HPV: NOT DETECTED

## 2017-08-10 ENCOUNTER — Ambulatory Visit
Admission: RE | Admit: 2017-08-10 | Discharge: 2017-08-10 | Disposition: A | Discharge status: Home / Self Care | Payer: BLUE CROSS/BLUE SHIELD | Source: Ambulatory Visit | Attending: Obstetrics and Gynecology | Admitting: Obstetrics and Gynecology

## 2017-08-10 DIAGNOSIS — R102 Pelvic and perineal pain: Secondary | ICD-10-CM

## 2017-10-19 ENCOUNTER — Encounter: Payer: Self-pay | Admitting: Physician Assistant

## 2017-10-19 ENCOUNTER — Ambulatory Visit (INDEPENDENT_AMBULATORY_CARE_PROVIDER_SITE_OTHER): Payer: BLUE CROSS/BLUE SHIELD | Admitting: Physician Assistant

## 2017-10-19 VITALS — BP 120/84 | HR 91 | Ht 64.0 in | Wt 176.0 lb

## 2017-10-19 DIAGNOSIS — G43109 Migraine with aura, not intractable, without status migrainosus: Secondary | ICD-10-CM

## 2017-10-19 DIAGNOSIS — M62838 Other muscle spasm: Secondary | ICD-10-CM

## 2017-10-19 DIAGNOSIS — G4489 Other headache syndrome: Secondary | ICD-10-CM

## 2017-10-19 MED ORDER — TOPIRAMATE 100 MG PO TABS: 100.0000 mg | ORAL_TABLET | Freq: Two times a day (BID) | ORAL | 6 refills | Status: DC

## 2017-10-19 MED ORDER — PROMETHAZINE HCL 12.5 MG PO TABS: 12.5000 mg | ORAL_TABLET | Freq: Four times a day (QID) | ORAL | 1 refills | Status: DC | PRN

## 2017-10-19 MED ORDER — SUMATRIPTAN SUCCINATE 100 MG PO TABS: 100.0000 mg | ORAL_TABLET | Freq: Once | ORAL | 11 refills | Status: DC | PRN

## 2017-10-19 MED ORDER — GALCANEZUMAB-GNLM 120 MG/ML ~~LOC~~ SOAJ: SUBCUTANEOUS | 11 refills | Status: DC

## 2017-10-19 NOTE — Patient Instructions (Signed)

## 2017-10-19 NOTE — Progress Notes (Signed)
History:  [name] is a 37 y.o. [id number] who presents to clinic today for headache followup.  She was in [employer] CGRP study and did beautifully.  She is unaware of what dosage she used.  She requests to use Emgality now for HA prevention.  In the 2 months that she has been off of the study drug, her headaches have increased tremendously.  They are frequent and severe.  With imitrex, 50mg  is not enough ant 100mg  is too much.  If she uses 100mg , she cannot go on with her day and instead has to lie down.    HIT6: Number of days in the last 4 weeks with:  Severe headache: 15 Moderate headache: 9 Mild headache: 4  No headache: 0   Past Medical History:  Diagnosis Date  . Anemia   . Anxiety    no medication  . Kidney disease    one one kidney not functioning well per patient - age 37 yrs, no current problems  . Migraine   . Seasonal allergies   . SVD (spontaneous vaginal delivery)    x 4    Social History   Socioeconomic History  . Marital status: Married    Spouse name: Not on file  . Number of children: Not on file  . Years of education: Not on file  . Highest education level: Not on file  Occupational History  . Not on file  Social Needs  . Financial resource strain: Not on file  . Food insecurity:    Worry: Not on file    Inability: Not on file  . Transportation needs:    Medical: Not on file    Non-medical: Not on file  Tobacco Use  . Smoking status: Current Every Day Smoker    Packs/day: 0.25    Years: 20.00    Pack years: 5.00    Types: Cigarettes  . Smokeless tobacco: Never Used  Substance and Sexual Activity  . Alcohol use: Yes    Comment: occasional   . Drug use: No  . Sexual activity: Yes    Partners: Male    Birth control/protection: Surgical    Comment: BTL   Lifestyle  . Physical activity:    Days per week: Not on file    Minutes per session: Not on file  . Stress: Not on file  Relationships  . Social connections:    Talks on phone: Not on  file    Gets together: Not on file    Attends religious service: Not on file    Active member of club or organization: Not on file    Attends meetings of clubs or organizations: Not on file    Relationship status: Not on file  . Intimate partner violence:    Fear of current or ex partner: Not on file    Emotionally abused: Not on file    Physically abused: Not on file    Forced sexual activity: Not on file  Other Topics Concern  . Not on file  Social History Narrative  . Not on file    Family History  Problem Relation Age of Onset  . Hypertension Mother   . Cervical cancer Mother        Precancerous  . Breast cancer Other     Allergies  Allergen Reactions  . Percocet [Oxycodone-Acetaminophen] Nausea And Vomiting    Current Outpatient Medications on File Prior to Visit  Medication Sig Dispense Refill  . acetaminophen (TYLENOL) 500 MG tablet Take  1,000 mg by mouth every 6 (six) hours as needed for headache.    . Aspirin-Acetaminophen-Caffeine (GOODY HEADACHE PO) Take 2 packets by mouth 2 (two) times daily as needed (MIGRAINES).    . cyclobenzaprine (FLEXERIL) 10 MG tablet Take 1 tablet (10 mg total) by mouth every 8 (eight) hours as needed for muscle spasms. (Patient taking differently: Take 10 mg by mouth as needed (FOR MIGRAINES). ) 30 tablet 1  . ibuprofen (ADVIL,MOTRIN) 200 MG tablet Take 800 mg by mouth every 6 (six) hours as needed for headache.    . promethazine (PHENERGAN) 12.5 MG tablet Take 1 tablet (12.5 mg total) by mouth every 6 (six) hours as needed for nausea or vomiting. 30 tablet 0  . SUMAtriptan (IMITREX) 100 MG tablet Take 1 tablet (100 mg total) by mouth once as needed for migraine. May repeat in 2 hours if headache persists or recurs. 9 tablet 11   No current facility-administered medications on file prior to visit.      Review of Systems:  All pertinent positive/negative included in HPI, all other review of systems are negative   Objective:  Physical  Exam BP 120/84   Pulse 91   Ht 5\' 4"  (1.626 m)   Wt 176 lb (79.8 kg)   BMI 30.21 kg/m  CONSTITUTIONAL: Well-developed, well-nourished female in no acute distress.  EYES: EOM intact ENT: Normocephalic CARDIOVASCULAR: Regular rate and rhythm  RESPIRATORY: Normal rate.   MUSCULOSKELETAL: Normal ROM SKIN: Warm, dry without erythema  NEUROLOGICAL: Alert, oriented, CN II-XII grossly intact, Appropriate balance PSYCH: Normal behavior, mood   Assessment & Plan:  Assessment: Worsening migraine with aura Muscle Spasm - intermittent  Plan: Restart Topamax - begin with 1/2 of 100mg  tab and titrate as tolerated to max dose of 200mg .  This will help with migraine prevention and may also help with weight loss Emgality - begin with 2 injections and then 1 injection q month thereafter.  Sample provided in office along with savings card.  Pt directed extensively in use.  Continue Imitrex with NSAID for acute HA.    Follow-up in 6-12 months or sooner PRN  Bertram Denver, PA-C 10/19/2017 8:18 AM

## 2017-11-30 ENCOUNTER — Encounter: Payer: Self-pay | Admitting: Physician Assistant

## 2017-11-30 ENCOUNTER — Ambulatory Visit: Payer: Self-pay | Admitting: Physician Assistant

## 2018-03-14 DIAGNOSIS — J209 Acute bronchitis, unspecified: Secondary | ICD-10-CM | POA: Diagnosis not present

## 2018-06-10 ENCOUNTER — Other Ambulatory Visit: Payer: Self-pay | Admitting: Physician Assistant

## 2018-10-16 ENCOUNTER — Other Ambulatory Visit: Payer: Self-pay | Admitting: *Deleted

## 2018-10-16 NOTE — Telephone Encounter (Signed)
Faxed over refill for emgality for 2 refills and pt will need to be seen for further refills to [facility].

## 2018-11-18 ENCOUNTER — Other Ambulatory Visit: Payer: Self-pay | Admitting: *Deleted

## 2018-11-18 MED ORDER — SUMATRIPTAN SUCCINATE 100 MG PO TABS: 100.0000 mg | ORAL_TABLET | Freq: Once | ORAL | 0 refills | Status: DC | PRN

## 2018-12-18 ENCOUNTER — Other Ambulatory Visit: Payer: Self-pay | Admitting: Primary Care

## 2019-01-03 ENCOUNTER — Other Ambulatory Visit: Payer: Self-pay

## 2019-01-03 ENCOUNTER — Ambulatory Visit (INDEPENDENT_AMBULATORY_CARE_PROVIDER_SITE_OTHER): Payer: BC Managed Care – PPO | Admitting: Physician Assistant

## 2019-01-03 ENCOUNTER — Encounter: Payer: Self-pay | Admitting: Physician Assistant

## 2019-01-03 VITALS — BP 123/84 | HR 94 | Ht 63.5 in | Wt 171.0 lb

## 2019-01-03 DIAGNOSIS — M62838 Other muscle spasm: Secondary | ICD-10-CM

## 2019-01-03 DIAGNOSIS — G4489 Other headache syndrome: Secondary | ICD-10-CM | POA: Diagnosis not present

## 2019-01-03 DIAGNOSIS — G43109 Migraine with aura, not intractable, without status migrainosus: Secondary | ICD-10-CM | POA: Diagnosis not present

## 2019-01-03 MED ORDER — CYCLOBENZAPRINE HCL 10 MG PO TABS: 10.0000 mg | ORAL_TABLET | Freq: Three times a day (TID) | ORAL | 1 refills | Status: DC | PRN

## 2019-01-03 MED ORDER — EMGALITY 120 MG/ML ~~LOC~~ SOAJ: 120.0000 mg | SUBCUTANEOUS | 11 refills | Status: DC

## 2019-01-03 MED ORDER — TOPIRAMATE 100 MG PO TABS: ORAL_TABLET | ORAL | 11 refills | Status: DC

## 2019-01-03 MED ORDER — PROMETHAZINE HCL 12.5 MG RE SUPP: 12.5000 mg | Freq: Four times a day (QID) | RECTAL | 0 refills | Status: DC | PRN

## 2019-01-03 MED ORDER — SUMATRIPTAN SUCCINATE 100 MG PO TABS: 100.0000 mg | ORAL_TABLET | Freq: Once | ORAL | 11 refills | Status: DC | PRN

## 2019-01-03 MED ORDER — EMGALITY 120 MG/ML ~~LOC~~ SOAJ: SUBCUTANEOUS | 11 refills | Status: DC

## 2019-01-03 NOTE — Patient Instructions (Signed)

## 2019-01-03 NOTE — Progress Notes (Signed)
History:  [name] is a 38 y.o. [id number] who presents to clinic today for yearly headache followup.  Emgality was working really well but is now out of refills. In July she had no headaches but in August she had headaches half the days.  Last injection was 8/10.  Her worst headache started 8/14 and lasted more than 24 hours.  She has had to use 4 Imitrex tablets of 100mg  this week alone.  She has had to use phenergan for nausea/vomitting.  She is using Topamax but sporadically.  HIT6: 67 Number of days in the last 4 weeks with:  Severe headache: 9 Moderate headache: 0 Mild headache: 4  No headache: 15   Past Medical History:  Diagnosis Date  . Anemia   . Anxiety    no medication  . Kidney disease    one one kidney not functioning well per patient - age 71 yrs, no current problems  . Migraine   . Seasonal allergies   . SVD (spontaneous vaginal delivery)    x 4    Social History   Socioeconomic History  . Marital status: Married    Spouse name: Not on file  . Number of children: Not on file  . Years of education: Not on file  . Highest education level: Not on file  Occupational History  . Not on file  Social Needs  . Financial resource strain: Not on file  . Food insecurity    Worry: Not on file    Inability: Not on file  . Transportation needs    Medical: Not on file    Non-medical: Not on file  Tobacco Use  . Smoking status: Current Every Day Smoker    Packs/day: 0.25    Years: 20.00    Pack years: 5.00    Types: Cigarettes  . Smokeless tobacco: Never Used  Substance and Sexual Activity  . Alcohol use: Yes    Comment: occasional   . Drug use: No  . Sexual activity: Yes    Partners: Male    Birth control/protection: Surgical    Comment: BTL   Lifestyle  . Physical activity    Days per week: Not on file    Minutes per session: Not on file  . Stress: Not on file  Relationships  . Social Herbalist on phone: Not on file    Gets together: Not  on file    Attends religious service: Not on file    Active member of club or organization: Not on file    Attends meetings of clubs or organizations: Not on file    Relationship status: Not on file  . Intimate partner violence    Fear of current or ex partner: Not on file    Emotionally abused: Not on file    Physically abused: Not on file    Forced sexual activity: Not on file  Other Topics Concern  . Not on file  Social History Narrative  . Not on file    Family History  Problem Relation Age of Onset  . Hypertension Mother   . Cervical cancer Mother        Precancerous  . Breast cancer Other     Allergies  Allergen Reactions  . Percocet [Oxycodone-Acetaminophen] Nausea And Vomiting    Current Outpatient Medications on File Prior to Visit  Medication Sig Dispense Refill  . acetaminophen (TYLENOL) 500 MG tablet Take 1,000 mg by mouth every 6 (six) hours as needed  for headache.    . Aspirin-Acetaminophen-Caffeine (GOODY HEADACHE PO) Take 2 packets by mouth 2 (two) times daily as needed (MIGRAINES).    . Galcanezumab-gnlm (EMGALITY) 120 MG/ML SOAJ Inject 240 mg into the skin as directed AND 120 mg every 30 (thirty) days. Inj 240mg  once then 120mg  monthly. 1 pen 11  . ibuprofen (ADVIL,MOTRIN) 200 MG tablet Take 800 mg by mouth every 6 (six) hours as needed for headache.    . promethazine (PHENERGAN) 12.5 MG tablet Take 1 tablet (12.5 mg total) by mouth every 6 (six) hours as needed for nausea or vomiting. 30 tablet 1  . SUMAtriptan (IMITREX) 100 MG tablet Take 1 tablet (100 mg total) by mouth once as needed for up to 1 dose for migraine. May repeat in 2 hours if headache persists or recurs. 9 tablet 0  . topiramate (TOPAMAX) 100 MG tablet TAKE 1 TABLET(100 MG) BY MOUTH TWICE DAILY 60 tablet 6  . cyclobenzaprine (FLEXERIL) 10 MG tablet Take 1 tablet (10 mg total) by mouth every 8 (eight) hours as needed for muscle spasms. (Patient taking differently: Take 10 mg by mouth as needed  (FOR MIGRAINES). ) 30 tablet 1   No current facility-administered medications on file prior to visit.      Review of Systems:  All pertinent positive/negative included in HPI, all other review of systems are negative   Objective:  Physical Exam BP 123/84   Pulse 94   Ht 5' 3.5" (1.613 m)   Wt 171 lb (77.6 kg)   BMI 29.82 kg/m  CONSTITUTIONAL: Well-developed, well-nourished female in no acute distress.  EYES: EOM intact ENT: Normocephalic CARDIOVASCULAR: Regular rate  RESPIRATORY: Normal rate.  MUSCULOSKELETAL: Normal ROM SKIN: Warm, dry without erythema  NEUROLOGICAL: Alert, oriented, CN II-XII grossly intact, Appropriate balance PSYCH: Normal behavior, mood   Assessment & Plan:  Assessment: Migraine - worsened x 1 month   Plan: Will continue with emgality monthly for now - may consider changing to other CGRP blocker if no improvement - Pt to call Use Topamax regularly for improved migraine prevention Phenergan suppository in place of tablet to help tolerate with extreme nausea Flexeril - do not be afraid to use - maximize this medication  Follow-up in 12 months or sooner PRN  Bertram Denvereague Clark, Markcus Lazenby E, PA-C 01/03/2019 8:17 AM

## 2019-01-30 ENCOUNTER — Other Ambulatory Visit: Payer: Self-pay

## 2019-01-30 NOTE — Telephone Encounter (Signed)
Open in error

## 2019-07-14 ENCOUNTER — Encounter: Payer: Self-pay | Admitting: *Deleted

## 2019-09-15 DIAGNOSIS — H35363 Drusen (degenerative) of macula, bilateral: Secondary | ICD-10-CM | POA: Diagnosis not present

## 2020-01-18 ENCOUNTER — Other Ambulatory Visit: Payer: Self-pay | Admitting: Physician Assistant

## 2020-01-23 ENCOUNTER — Encounter: Payer: Self-pay | Admitting: Physician Assistant

## 2020-01-23 ENCOUNTER — Other Ambulatory Visit: Payer: Self-pay

## 2020-01-23 ENCOUNTER — Ambulatory Visit (INDEPENDENT_AMBULATORY_CARE_PROVIDER_SITE_OTHER): Payer: BC Managed Care – PPO | Admitting: Physician Assistant

## 2020-01-23 VITALS — BP 133/87 | HR 94 | Wt 176.0 lb

## 2020-01-23 DIAGNOSIS — G43109 Migraine with aura, not intractable, without status migrainosus: Secondary | ICD-10-CM

## 2020-01-23 MED ORDER — EMGALITY 120 MG/ML ~~LOC~~ SOAJ: 120.0000 mg | SUBCUTANEOUS | 11 refills | Status: DC

## 2020-01-23 MED ORDER — RIZATRIPTAN BENZOATE 10 MG PO TABS: 10.0000 mg | ORAL_TABLET | ORAL | 6 refills | Status: DC | PRN

## 2020-01-23 MED ORDER — PROMETHAZINE HCL 12.5 MG RE SUPP: 12.5000 mg | Freq: Four times a day (QID) | RECTAL | 0 refills | Status: DC | PRN

## 2020-01-23 NOTE — Progress Notes (Signed)
History:  [name] is a 39 y.o. [id number] who presents to clinic today for yearly headache visit.  She has seen really good results from monthly Emgality although she feels it wears off after 3 weeks.  She has about 1 migraine per month that requires treatment.  She hesitates to use imitrex due to side effects.  Overall quality of headaches continues without change.  HIT6: 54 Number of days in the last 4 weeks with:  Severe headache: 0 Moderate headache: 4 Mild headache: 2  No headache: 22   Past Medical History:  Diagnosis Date  . Anemia   . Anxiety    no medication  . Kidney disease    one one kidney not functioning well per patient - age 55 yrs, no current problems  . Migraine   . Seasonal allergies   . SVD (spontaneous vaginal delivery)    x 4    Social History   Socioeconomic History  . Marital status: Married    Spouse name: Not on file  . Number of children: Not on file  . Years of education: Not on file  . Highest education level: Not on file  Occupational History  . Not on file  Tobacco Use  . Smoking status: Current Every Day Smoker    Packs/day: 0.25    Years: 20.00    Pack years: 5.00    Types: Cigarettes  . Smokeless tobacco: Never Used  Vaping Use  . Vaping Use: Never used  Substance and Sexual Activity  . Alcohol use: Yes    Comment: occasional   . Drug use: No  . Sexual activity: Yes    Partners: Male    Birth control/protection: Surgical    Comment: BTL   Other Topics Concern  . Not on file  Social History Narrative  . Not on file   Social Determinants of Health   Financial Resource Strain:   . Difficulty of Paying Living Expenses: Not on file  Food Insecurity:   . Worried About Programme researcher, broadcasting/film/video in the Last Year: Not on file  . Ran Out of Food in the Last Year: Not on file  Transportation Needs:   . Lack of Transportation (Medical): Not on file  . Lack of Transportation (Non-Medical): Not on file  Physical Activity:   . Days of  Exercise per Week: Not on file  . Minutes of Exercise per Session: Not on file  Stress:   . Feeling of Stress : Not on file  Social Connections:   . Frequency of Communication with Friends and Family: Not on file  . Frequency of Social Gatherings with Friends and Family: Not on file  . Attends Religious Services: Not on file  . Active Member of Clubs or Organizations: Not on file  . Attends Banker Meetings: Not on file  . Marital Status: Not on file  Intimate Partner Violence:   . Fear of Current or Ex-Partner: Not on file  . Emotionally Abused: Not on file  . Physically Abused: Not on file  . Sexually Abused: Not on file    Family History  Problem Relation Age of Onset  . Hypertension Mother   . Cervical cancer Mother        Precancerous  . Breast cancer Other     Allergies  Allergen Reactions  . Percocet [Oxycodone-Acetaminophen] Nausea And Vomiting    Current Outpatient Medications on File Prior to Visit  Medication Sig Dispense Refill  . acetaminophen (TYLENOL) 500  MG tablet Take 1,000 mg by mouth every 6 (six) hours as needed for headache.    . Aspirin-Acetaminophen-Caffeine (GOODY HEADACHE PO) Take 2 packets by mouth 2 (two) times daily as needed (MIGRAINES).    . Galcanezumab-gnlm (EMGALITY) 120 MG/ML SOAJ Inject 120 mg into the skin every 30 (thirty) days. 1 pen 11  . ibuprofen (ADVIL,MOTRIN) 200 MG tablet Take 800 mg by mouth every 6 (six) hours as needed for headache.    . promethazine (PHENERGAN) 12.5 MG suppository Place 1 suppository (12.5 mg total) rectally every 6 (six) hours as needed for nausea or vomiting. 12 each 0  . SUMAtriptan (IMITREX) 100 MG tablet Take 1 tablet (100 mg total) by mouth once as needed for up to 30 doses for migraine. May repeat in 2 hours if headache persists or recurs. 9 tablet 11  . cyclobenzaprine (FLEXERIL) 10 MG tablet Take 1 tablet (10 mg total) by mouth every 8 (eight) hours as needed (headache/muscle spasm). (Patient  not taking: Reported on 01/23/2020) 40 tablet 1  . topiramate (TOPAMAX) 100 MG tablet TAKE 1 TABLET(100 MG) BY MOUTH TWICE DAILY (Patient not taking: Reported on 01/23/2020) 60 tablet 11   No current facility-administered medications on file prior to visit.     Review of Systems:  All pertinent positive/negative included in HPI, all other review of systems are negative   Objective:  Physical Exam BP 133/87   Pulse 94   Wt 176 lb (79.8 kg)   BMI 30.69 kg/m  CONSTITUTIONAL: Well-developed, well-nourished female in no acute distress.  EYES: EOM intact ENT: Normocephalic CARDIOVASCULAR: Regular rate RESPIRATORY: Normal rate. MUSCULOSKELETAL: Normal ROM SKIN: Warm, dry without erythema  NEUROLOGICAL: Alert, oriented, CN II-XII grossly intact, Appropriate balance, gait PSYCH: Normal behavior, mood   Assessment & Plan:  Assessment:migraine with aura: stable frequency but acute treatment is lacking Plan: Will trial Maxalt in place of Imitrex for acute treatment of migraine  Will give sample of nurtec to try for acute migraine Continue emgality for prevention Phenergan suppository for nausea/vomiting prn  Follow-up in 12 months or sooner PRN  Bertram Denver, PA-C 01/23/2020 8:16 AM

## 2020-01-27 IMAGING — US US PELVIS COMPLETE TRANSABD/TRANSVAG
1 series · 13 of 25 positions shown · non-contrast
Comparison: None

CLINICAL DATA: Right-sided pelvic pain for 2 months.

EXAM:
TRANSABDOMINAL AND TRANSVAGINAL ULTRASOUND OF PELVIS
TECHNIQUE: Both transabdominal and transvaginal ultrasound examinations of the
pelvis were performed. Transabdominal technique was performed for
global imaging of the pelvis including uterus, ovaries, adnexal
regions, and pelvic cul-de-sac. It was necessary to proceed with
endovaginal exam following the transabdominal exam to visualize the
endometrium and ovaries.

[Series 1: us pelvis complete transabd/transvag · 0.20mm/px · 13 of 92 slices shown]
[im 1/92]
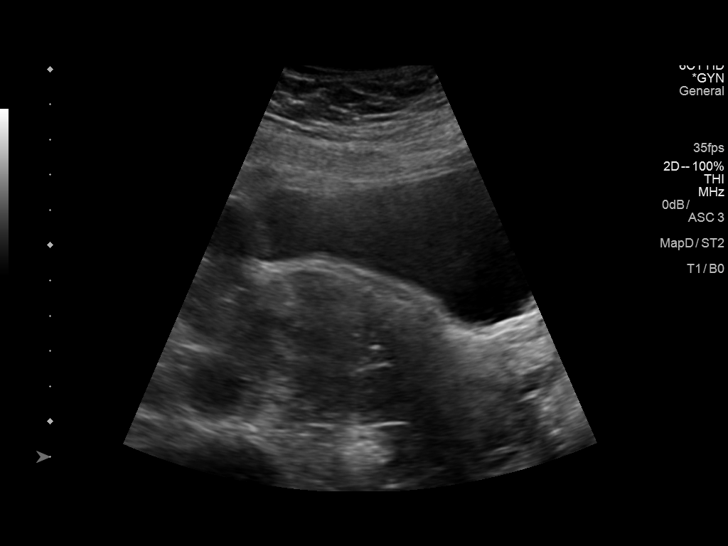
[im 8/92]
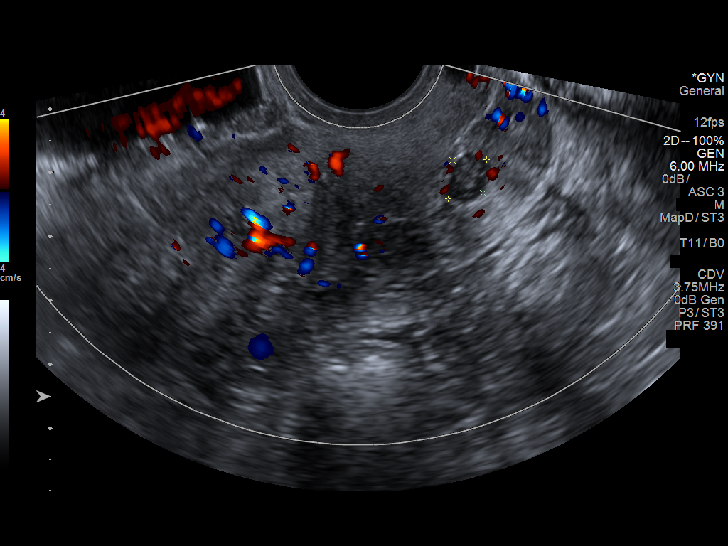
[im 16/92]
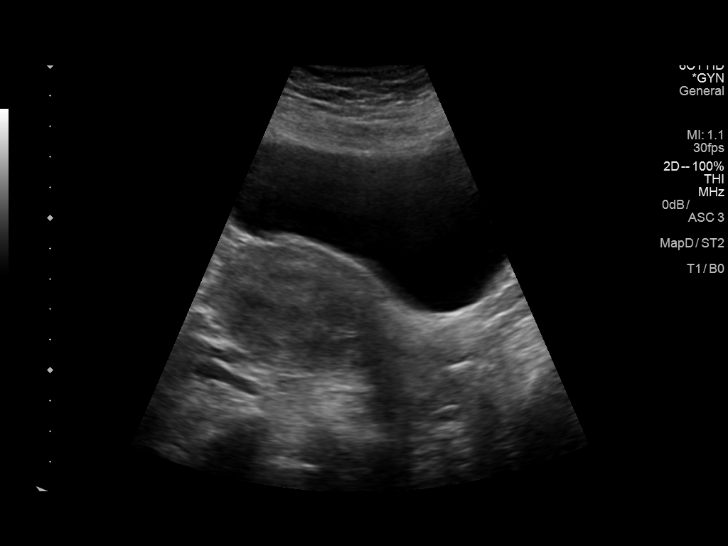
[im 23/92]
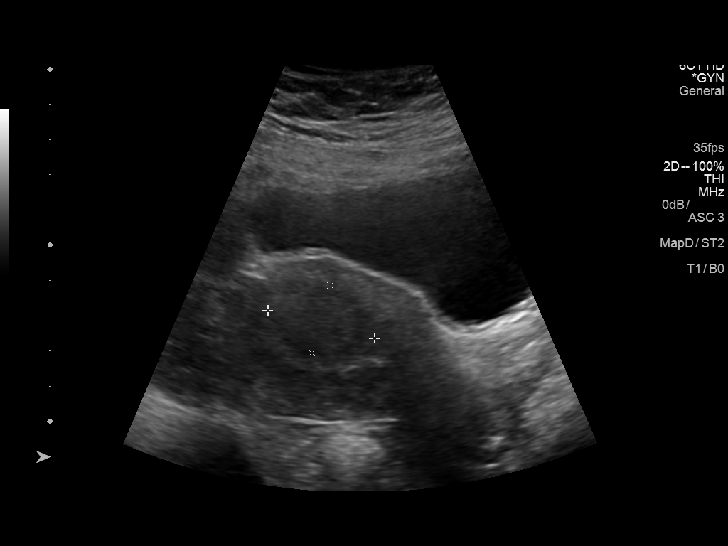
[im 31/92]
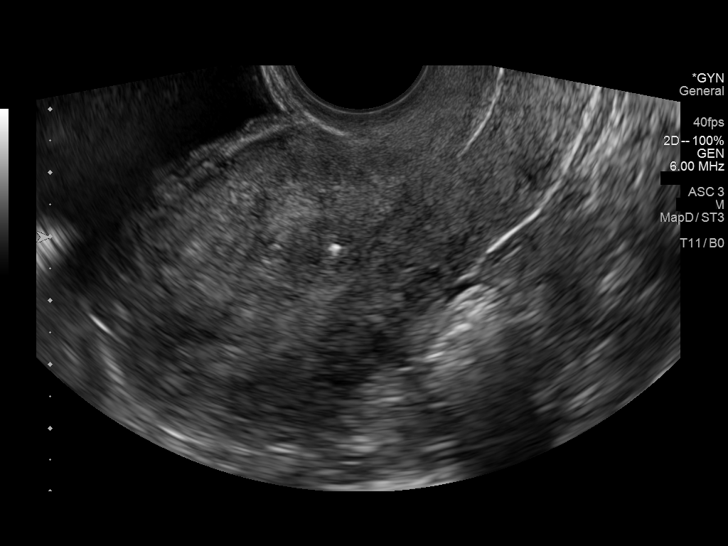
[im 38/92]
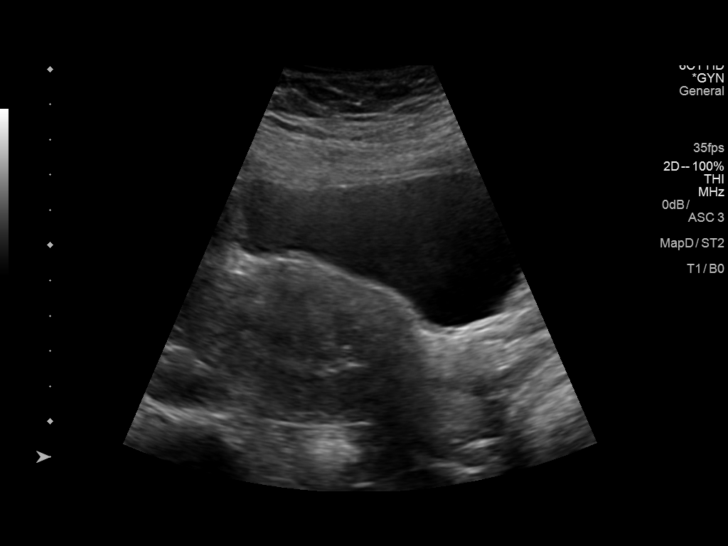
[im 46/92]
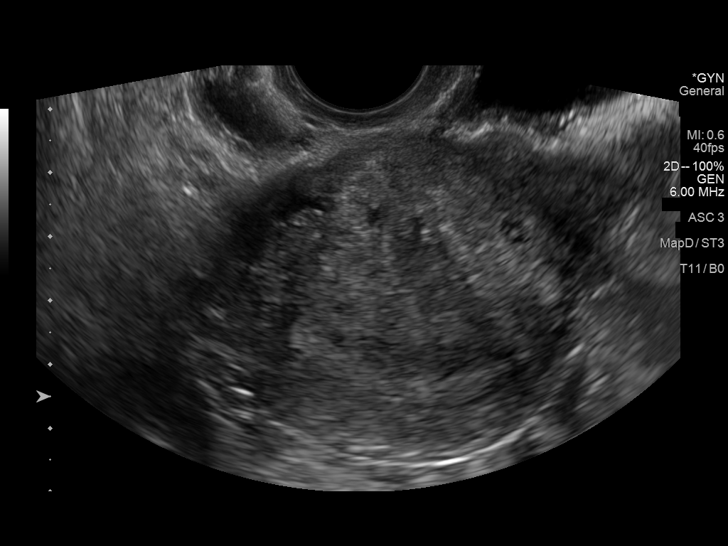
[im 54/92]
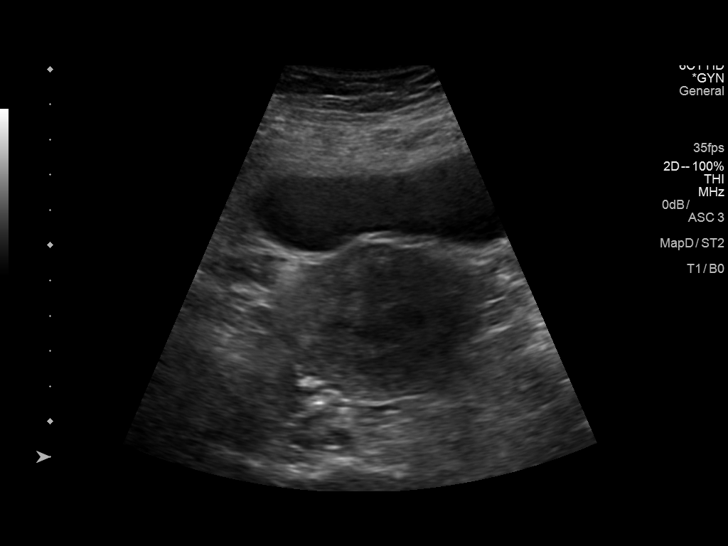
[im 61/92]
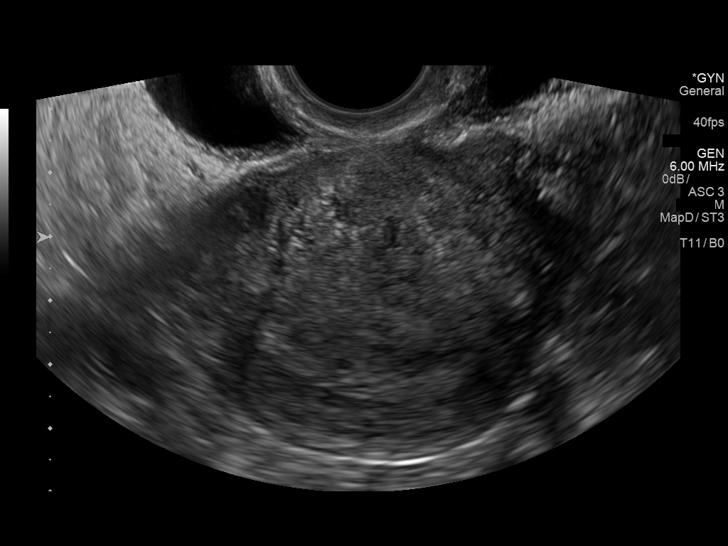
[im 69/92]
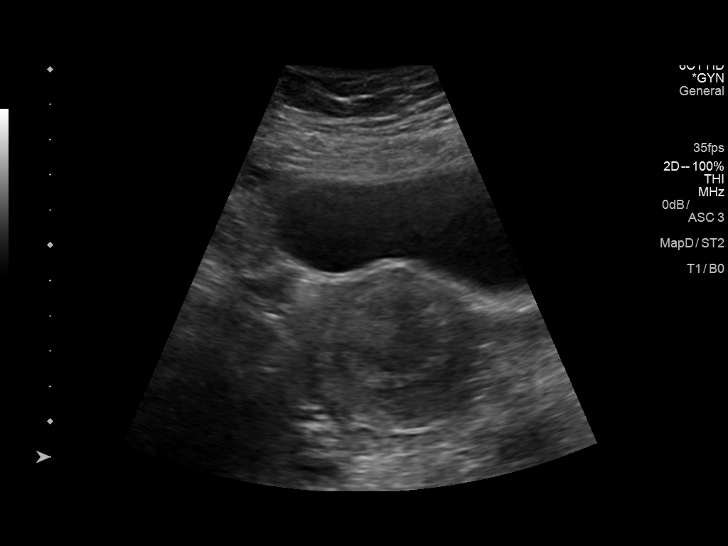
[im 76/92]
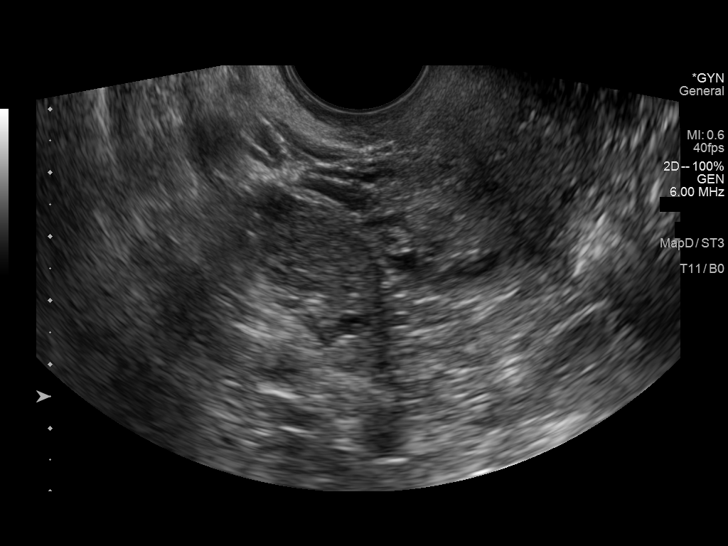
[im 84/92]
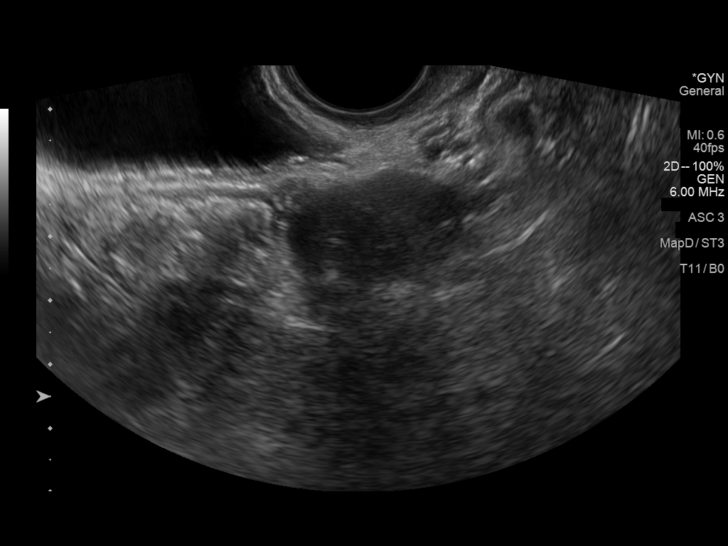
[im 92/92]
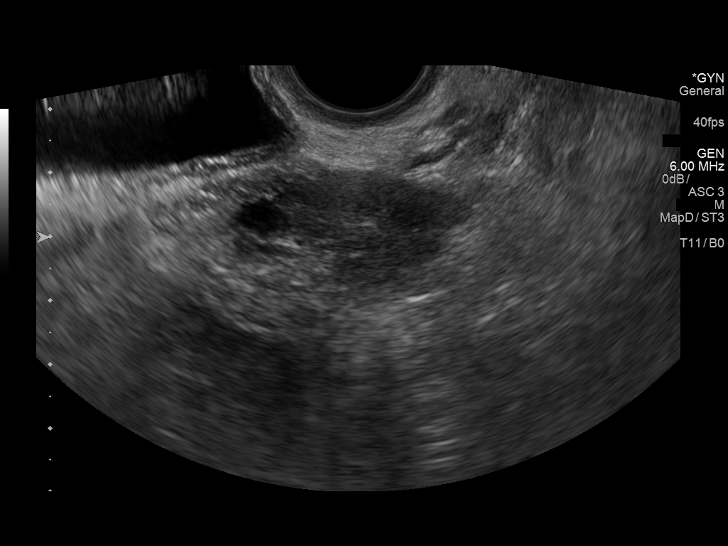

[13 of 25 positions shown; findings below may reference images not displayed]

FINDINGS: Uterus

Measurements: 9.2 x 4.7 x 5.3 cm. Diffusely heterogeneous
echotexture of the uterine myometrium is seen. A very poorly
circumscribed area of heterogeneous myometrial echogenicity with
several tiny cystic foci and a focus of calcification is seen in the
anterior wall. This could represent a focal adenomyoma versus a
fibroid.

Endometrium

Thickness: 6 mm.  No definite focal abnormality visualized.

Right ovary

Measurements: 3.4 x 1.7 x 2.1 cm. Normal appearance/no adnexal mass.

Left ovary

Measurements: 2.8 x 1.8 x 2.4 cm. Normal appearance/no adnexal mass.

Other findings

No abnormal free fluid.
IMPRESSION: Focal adenomyoma versus fibroid in the anterior uterine corpus.
Consider pelvic MRI without contrast for further evaluation.

Normal appearance of both ovaries.  No adnexal mass identified.

## 2020-03-11 ENCOUNTER — Other Ambulatory Visit: Payer: Self-pay | Admitting: *Deleted

## 2020-03-11 ENCOUNTER — Ambulatory Visit (INDEPENDENT_AMBULATORY_CARE_PROVIDER_SITE_OTHER): Payer: BC Managed Care – PPO | Admitting: Obstetrics and Gynecology

## 2020-03-11 ENCOUNTER — Encounter: Payer: Self-pay | Admitting: *Deleted

## 2020-03-11 ENCOUNTER — Encounter: Payer: Self-pay | Admitting: Obstetrics and Gynecology

## 2020-03-11 ENCOUNTER — Other Ambulatory Visit: Payer: Self-pay

## 2020-03-11 ENCOUNTER — Other Ambulatory Visit (HOSPITAL_COMMUNITY)
Admission: RE | Admit: 2020-03-11 | Discharge: 2020-03-11 | Disposition: A | Discharge status: Home / Self Care | Payer: BC Managed Care – PPO | Source: Ambulatory Visit | Attending: Obstetrics and Gynecology | Admitting: Obstetrics and Gynecology

## 2020-03-11 VITALS — BP 132/86 | HR 91 | Ht 63.0 in | Wt 175.0 lb

## 2020-03-11 DIAGNOSIS — Z01419 Encounter for gynecological examination (general) (routine) without abnormal findings: Secondary | ICD-10-CM

## 2020-03-11 MED ORDER — NURTEC 75 MG PO TBDP: 75.0000 mg | ORAL_TABLET | ORAL | 11 refills | Status: DC | PRN

## 2020-03-11 NOTE — Progress Notes (Signed)
Obstetrics and Gynecology Annual Patient Evaluation  Appointment Date: 03/11/2020  OBGYN Clinic: [facility]   Primary Care Provider: Patient, No Pcp Per  Referring Provider: No ref. provider found  Chief Complaint:  Chief Complaint  Patient presents with  . Gynecologic Exam    History of Present Illness: [name] is a 39 y.o. [id number] seen for the above chief complaint.  Occasional dyspareunia with deeper penetration.   Review of Systems: Pertinent items noted in HPI and remainder of comprehensive ROS otherwise negative.    Past Medical History:  Past Medical History:  Diagnosis Date  . Anemia   . Anxiety    no medication  . Endometritis following delivery 12/25/2012  . Kidney disease    one one kidney not functioning well per patient - age 29 yrs, no current problems  . Migraine   . Seasonal allergies     Past Surgical History:  Past Surgical History:  Procedure Laterality Date  . APPENDECTOMY    . EYE SURGERY Right    removed cyst bottom lid - age 56 yrs  . IUD REMOVAL N/A 09/07/2016   Procedure: INTRAUTERINE DEVICE (IUD) REMOVAL;  Surgeon: Adam Phenix, MD;  Location: [facility];  Service: Gynecology;  Laterality: N/A;  . LAPAROSCOPIC TUBAL LIGATION Bilateral 09/07/2016   Procedure: LAPAROSCOPIC TUBAL LIGATION;  Surgeon: Adam Phenix, MD;  Location: [facility];  Service: Gynecology;  Laterality: Bilateral;    Past Obstetrical History:  OB History  Gravida Para Term Preterm AB Living  5 4 4  0 1 4  SAB TAB Ectopic Multiple Live Births  1 0 0 0 4    # Outcome Date GA Lbr Len/2nd Weight Sex Delivery Anes PTL Lv  5 Term 12/19/12 [redacted]w[redacted]d 03:04 / 00:06 5 lb 4.8 oz (2.404 kg) F Vag-Spont EPI  LIV  4 SAB           3 Term           2 Term           1 Term             Obstetric Comments  SVD x 4    Past Gynecological History: As per HPI. Periods: qmonth, regular 4 days, first two days heavy and painful History of Pap Smear(s):  Yes.   Last pap 2019, which was negative She is currently using bilateral tubal ligation for contraception.   Social History:  Social History   Socioeconomic History  . Marital status: Married    Spouse name: Not on file  . Number of children: Not on file  . Years of education: Not on file  . Highest education level: Not on file  Occupational History  . Not on file  Tobacco Use  . Smoking status: Current Every Day Smoker    Packs/day: 0.25    Years: 20.00    Pack years: 5.00    Types: Cigarettes  . Smokeless tobacco: Never Used  Vaping Use  . Vaping Use: Never used  Substance and Sexual Activity  . Alcohol use: Yes    Comment: occasional   . Drug use: No  . Sexual activity: Yes    Partners: Male    Birth control/protection: Surgical    Comment: BTL   Other Topics Concern  . Not on file  Social History Narrative  . Not on file   Social Determinants of Health   Financial Resource Strain:   . Difficulty of Paying Living Expenses: Not  on file  Food Insecurity:   . Worried About Programme researcher, broadcasting/film/video in the Last Year: Not on file  . Ran Out of Food in the Last Year: Not on file  Transportation Needs:   . Lack of Transportation (Medical): Not on file  . Lack of Transportation (Non-Medical): Not on file  Physical Activity:   . Days of Exercise per Week: Not on file  . Minutes of Exercise per Session: Not on file  Stress:   . Feeling of Stress : Not on file  Social Connections:   . Frequency of Communication with Friends and Family: Not on file  . Frequency of Social Gatherings with Friends and Family: Not on file  . Attends Religious Services: Not on file  . Active Member of Clubs or Organizations: Not on file  . Attends Banker Meetings: Not on file  . Marital Status: Not on file  Intimate Partner Violence:   . Fear of Current or Ex-Partner: Not on file  . Emotionally Abused: Not on file  . Physically Abused: Not on file  . Sexually Abused: Not on  file    Family History:  Family History  Problem Relation Age of Onset  . Hypertension Mother   . Cervical cancer Mother        Precancerous  . Breast cancer Other   Breast cancer in aunt in her 68s  Medications [name] had no medications administered during this visit. Current Outpatient Medications  Medication Sig Dispense Refill  . acetaminophen (TYLENOL) 500 MG tablet Take 1,000 mg by mouth every 6 (six) hours as needed for headache.    . Aspirin-Acetaminophen-Caffeine (GOODY HEADACHE PO) Take 2 packets by mouth 2 (two) times daily as needed (MIGRAINES).    . Galcanezumab-gnlm (EMGALITY) 120 MG/ML SOAJ Inject 120 mg into the skin every 30 (thirty) days. 1 mL 11  . ibuprofen (ADVIL,MOTRIN) 200 MG tablet Take 800 mg by mouth every 6 (six) hours as needed for headache.    . promethazine (PHENERGAN) 12.5 MG suppository Place 1 suppository (12.5 mg total) rectally every 6 (six) hours as needed for nausea or vomiting. 12 each 0  . rizatriptan (MAXALT) 10 MG tablet Take 1 tablet (10 mg total) by mouth as needed for migraine. May repeat in 2 hours if needed 12 tablet 6  . cyclobenzaprine (FLEXERIL) 10 MG tablet Take 1 tablet (10 mg total) by mouth every 8 (eight) hours as needed (headache/muscle spasm). (Patient not taking: Reported on 01/23/2020) 40 tablet 1  . Rimegepant Sulfate (NURTEC) 75 MG TBDP Take 75 mg by mouth as needed (headache). 8 tablet 11   No current facility-administered medications for this visit.    Allergies Percocet [oxycodone-acetaminophen]   Physical Exam:  BP 132/86   Pulse 91   Ht 5\' 3"  (1.6 m)   Wt 175 lb (79.4 kg)   BMI 31.00 kg/m  Body mass index is 31 kg/m. General appearance: Well nourished, well developed female in no acute distress.  Neck:  Supple, normal appearance, and no thyromegaly  Cardiovascular: normal s1 and s2.  No murmurs, rubs or gallops. Respiratory:  Clear to auscultation bilateral. Normal respiratory effort Abdomen: positive  bowel sounds and no masses, hernias; diffusely non tender to palpation, non distended Breasts: breasts appear normal, no suspicious masses, no skin or nipple changes or axillary nodes, and normal palpation. Neuro/Psych:  Normal mood and affect.  Skin:  Warm and dry.  Lymphatic:  No inguinal lymphadenopathy.   Pelvic exam: is  not limited by body habitus EGBUS: within normal limits Vagina: within normal limits and with no blood or discharge in the vault Cervix: normal appearing cervix without tenderness, discharge or lesions. Uterus:  nonenlarged (8wk sized, mobile) and non tender Adnexa:  normal adnexa and no mass, fullness, tenderness Rectovaginal: deferred  Laboratory: none  Radiology: no new imaging  Assessment: pt stable  Plan:  1. Well woman exam with routine gynecological exam Routine care. Pt desires pap smear today. D/w her her prior u/s done in 2019 because she doesn't believe it had ever been d/w her. I told her it looks like she may have some adenomyosis. I told her that it would be rare after vag deliveries but still possible. I also told her that given her no to mild s/s that I recommend exp management that got better on it's own. If tx needed in the future, I told her I'd recommend Mirena first.  - Cytology - PAP - CBC - TSH - Hemoglobin A1c - Comprehensive metabolic panel - Lipid panel - VITAMIN D 25 Hydroxy (Vit-D Deficiency, Fractures)  Orders Placed This Encounter  Procedures  . CBC  . TSH  . Hemoglobin A1c  . Comprehensive metabolic panel  . Lipid panel  . VITAMIN D 25 Hydroxy (Vit-D Deficiency, Fractures)    RTC 1 year and PRN  Cornelia Copa MD Attending [facility])

## 2020-03-12 LAB — COMPREHENSIVE METABOLIC PANEL
ALT: 35 IU/L — ABNORMAL HIGH (ref 0–32)
AST: 22 IU/L (ref 0–40)
Albumin/Globulin Ratio: 1.8 (ref 1.2–2.2)
Albumin: 4.5 g/dL (ref 3.8–4.8)
Alkaline Phosphatase: 60 IU/L (ref 44–121)
BUN/Creatinine Ratio: 27 — ABNORMAL HIGH (ref 9–23)
BUN: 16 mg/dL (ref 6–20)
Bilirubin Total: 0.3 mg/dL (ref 0.0–1.2)
CO2: 21 mmol/L (ref 20–29)
Calcium: 9.4 mg/dL (ref 8.7–10.2)
Chloride: 102 mmol/L (ref 96–106)
Creatinine, Ser: 0.6 mg/dL (ref 0.57–1.00)
GFR calc Af Amer: 134 mL/min/{1.73_m2} (ref >59)
GFR calc non Af Amer: 116 mL/min/{1.73_m2} (ref >59)
Globulin, Total: 2.5 g/dL (ref 1.5–4.5)
Glucose: 92 mg/dL (ref 65–99)
Potassium: 4.4 mmol/L (ref 3.5–5.2)
Sodium: 136 mmol/L (ref 134–144)
Total Protein: 7 g/dL (ref 6.0–8.5)

## 2020-03-12 LAB — LIPID PANEL
Chol/HDL Ratio: 5.6 ratio — ABNORMAL HIGH (ref 0.0–4.4)
Cholesterol, Total: 267 mg/dL — ABNORMAL HIGH (ref 100–199)
HDL: 48 mg/dL (ref >39)
LDL Chol Calc (NIH): 173 mg/dL — ABNORMAL HIGH (ref 0–99)
Triglycerides: 245 mg/dL — ABNORMAL HIGH (ref 0–149)
VLDL Cholesterol Cal: 46 mg/dL — ABNORMAL HIGH (ref 5–40)

## 2020-03-12 LAB — CBC
Hematocrit: 44.8 % (ref 34.0–46.6)
Hemoglobin: 15.1 g/dL (ref 11.1–15.9)
MCH: 29.5 pg (ref 26.6–33.0)
MCHC: 33.7 g/dL (ref 31.5–35.7)
MCV: 88 fL (ref 79–97)
Platelets: 291 10*3/uL (ref 150–450)
RBC: 5.12 x10E6/uL (ref 3.77–5.28)
RDW: 11.9 % (ref 11.7–15.4)
WBC: 10.5 10*3/uL (ref 3.4–10.8)

## 2020-03-12 LAB — HEMOGLOBIN A1C
Est. average glucose Bld gHb Est-mCnc: 114 mg/dL
Hgb A1c MFr Bld: 5.6 % (ref 4.8–5.6)

## 2020-03-12 LAB — VITAMIN D 25 HYDROXY (VIT D DEFICIENCY, FRACTURES): Vit D, 25-Hydroxy: 23.5 ng/mL — ABNORMAL LOW (ref 30.0–100.0)

## 2020-03-12 LAB — TSH: TSH: 1.37 u[IU]/mL (ref 0.450–4.500)

## 2020-03-16 LAB — CYTOLOGY - PAP
Comment: NEGATIVE
Diagnosis: NEGATIVE
High risk HPV: NEGATIVE

## 2020-03-17 ENCOUNTER — Encounter: Payer: Self-pay | Admitting: Obstetrics and Gynecology

## 2020-03-17 DIAGNOSIS — R7989 Other specified abnormal findings of blood chemistry: Secondary | ICD-10-CM | POA: Insufficient documentation

## 2020-03-17 DIAGNOSIS — E785 Hyperlipidemia, unspecified: Secondary | ICD-10-CM | POA: Insufficient documentation

## 2020-03-17 DIAGNOSIS — R7401 Elevation of levels of liver transaminase levels: Secondary | ICD-10-CM | POA: Insufficient documentation

## 2020-03-17 DIAGNOSIS — E669 Obesity, unspecified: Secondary | ICD-10-CM | POA: Insufficient documentation

## 2020-03-17 DIAGNOSIS — E781 Pure hyperglyceridemia: Secondary | ICD-10-CM | POA: Insufficient documentation

## 2020-03-18 ENCOUNTER — Ambulatory Visit (INDEPENDENT_AMBULATORY_CARE_PROVIDER_SITE_OTHER): Payer: BC Managed Care – PPO | Admitting: *Deleted

## 2020-03-18 ENCOUNTER — Other Ambulatory Visit: Payer: Self-pay

## 2020-03-18 DIAGNOSIS — Z23 Encounter for immunization: Secondary | ICD-10-CM | POA: Diagnosis not present

## 2020-03-18 NOTE — Progress Notes (Signed)
Patient was assessed and managed by nursing staff during this encounter. I have reviewed the chart and agree with the documentation and plan. I have also made any necessary editorial changes.  Jaynie Collins, MD 03/18/2020 3:43 PM

## 2020-03-18 NOTE — Progress Notes (Signed)
Pt here for flu shot. Pt tolerated injection well.

## 2020-03-19 ENCOUNTER — Other Ambulatory Visit: Payer: Self-pay | Admitting: Physician Assistant

## 2020-04-19 ENCOUNTER — Encounter: Payer: Self-pay | Admitting: *Deleted

## 2020-07-10 DIAGNOSIS — Z1159 Encounter for screening for other viral diseases: Secondary | ICD-10-CM | POA: Diagnosis not present

## 2020-07-10 DIAGNOSIS — Z0000 Encounter for general adult medical examination without abnormal findings: Secondary | ICD-10-CM | POA: Diagnosis not present

## 2020-07-10 DIAGNOSIS — Z6839 Body mass index (BMI) 39.0-39.9, adult: Secondary | ICD-10-CM | POA: Diagnosis not present

## 2020-07-10 DIAGNOSIS — E559 Vitamin D deficiency, unspecified: Secondary | ICD-10-CM | POA: Diagnosis not present

## 2020-07-10 DIAGNOSIS — E8881 Metabolic syndrome: Secondary | ICD-10-CM | POA: Diagnosis not present

## 2020-07-10 DIAGNOSIS — Z131 Encounter for screening for diabetes mellitus: Secondary | ICD-10-CM | POA: Diagnosis not present

## 2020-07-23 DIAGNOSIS — R7303 Prediabetes: Secondary | ICD-10-CM | POA: Diagnosis not present

## 2020-07-23 DIAGNOSIS — E8881 Metabolic syndrome: Secondary | ICD-10-CM | POA: Diagnosis not present

## 2020-07-23 DIAGNOSIS — Z6839 Body mass index (BMI) 39.0-39.9, adult: Secondary | ICD-10-CM | POA: Diagnosis not present

## 2020-07-23 DIAGNOSIS — E559 Vitamin D deficiency, unspecified: Secondary | ICD-10-CM | POA: Diagnosis not present

## 2020-07-23 DIAGNOSIS — R635 Abnormal weight gain: Secondary | ICD-10-CM | POA: Diagnosis not present

## 2020-08-05 ENCOUNTER — Encounter: Payer: Self-pay | Admitting: *Deleted

## 2020-08-23 DIAGNOSIS — E8881 Metabolic syndrome: Secondary | ICD-10-CM | POA: Diagnosis not present

## 2020-08-23 DIAGNOSIS — Z6839 Body mass index (BMI) 39.0-39.9, adult: Secondary | ICD-10-CM | POA: Diagnosis not present

## 2020-08-23 DIAGNOSIS — R7303 Prediabetes: Secondary | ICD-10-CM | POA: Diagnosis not present

## 2020-08-23 DIAGNOSIS — R635 Abnormal weight gain: Secondary | ICD-10-CM | POA: Diagnosis not present

## 2020-09-12 DIAGNOSIS — R1031 Right lower quadrant pain: Secondary | ICD-10-CM | POA: Diagnosis not present

## 2020-09-12 DIAGNOSIS — R109 Unspecified abdominal pain: Secondary | ICD-10-CM | POA: Diagnosis not present

## 2020-09-12 DIAGNOSIS — M545 Low back pain, unspecified: Secondary | ICD-10-CM | POA: Diagnosis not present

## 2020-09-12 DIAGNOSIS — N3001 Acute cystitis with hematuria: Secondary | ICD-10-CM | POA: Diagnosis not present

## 2020-09-14 DIAGNOSIS — G43109 Migraine with aura, not intractable, without status migrainosus: Secondary | ICD-10-CM | POA: Diagnosis not present

## 2020-09-26 DIAGNOSIS — E559 Vitamin D deficiency, unspecified: Secondary | ICD-10-CM | POA: Diagnosis not present

## 2020-09-26 DIAGNOSIS — N926 Irregular menstruation, unspecified: Secondary | ICD-10-CM | POA: Diagnosis not present

## 2020-09-26 DIAGNOSIS — E8881 Metabolic syndrome: Secondary | ICD-10-CM | POA: Diagnosis not present

## 2020-09-26 DIAGNOSIS — R635 Abnormal weight gain: Secondary | ICD-10-CM | POA: Diagnosis not present

## 2020-09-26 DIAGNOSIS — Z6827 Body mass index (BMI) 27.0-27.9, adult: Secondary | ICD-10-CM | POA: Diagnosis not present

## 2020-09-26 DIAGNOSIS — R7303 Prediabetes: Secondary | ICD-10-CM | POA: Diagnosis not present

## 2020-09-26 DIAGNOSIS — Z79899 Other long term (current) drug therapy: Secondary | ICD-10-CM | POA: Diagnosis not present

## 2020-09-26 DIAGNOSIS — R300 Dysuria: Secondary | ICD-10-CM | POA: Diagnosis not present

## 2020-10-14 DIAGNOSIS — U071 COVID-19: Secondary | ICD-10-CM | POA: Diagnosis not present

## 2020-10-14 DIAGNOSIS — Z1152 Encounter for screening for COVID-19: Secondary | ICD-10-CM | POA: Diagnosis not present

## 2020-10-24 DIAGNOSIS — Z6827 Body mass index (BMI) 27.0-27.9, adult: Secondary | ICD-10-CM | POA: Diagnosis not present

## 2020-10-24 DIAGNOSIS — R7303 Prediabetes: Secondary | ICD-10-CM | POA: Diagnosis not present

## 2020-10-24 DIAGNOSIS — N926 Irregular menstruation, unspecified: Secondary | ICD-10-CM | POA: Diagnosis not present

## 2020-10-24 DIAGNOSIS — E559 Vitamin D deficiency, unspecified: Secondary | ICD-10-CM | POA: Diagnosis not present

## 2020-10-24 DIAGNOSIS — E8881 Metabolic syndrome: Secondary | ICD-10-CM | POA: Diagnosis not present

## 2020-10-24 DIAGNOSIS — R635 Abnormal weight gain: Secondary | ICD-10-CM | POA: Diagnosis not present

## 2020-11-15 ENCOUNTER — Telehealth: Payer: Self-pay | Admitting: *Deleted

## 2020-11-15 ENCOUNTER — Telehealth: Payer: Self-pay

## 2020-11-15 NOTE — Telephone Encounter (Signed)
New message    Patient is asking for a call back to discuss options on medication

## 2020-11-15 NOTE — Telephone Encounter (Deleted)
Error

## 2020-11-15 NOTE — Telephone Encounter (Signed)
PA requested through Cover My Meds for [employer]

## 2020-11-16 ENCOUNTER — Telehealth: Payer: Self-pay | Admitting: *Deleted

## 2020-11-16 NOTE — Telephone Encounter (Signed)
Called pt back, Pt states her insurance is no longer covering Emgality, they will only cover Aimovig. She has an appt with Clydie Braun on 9/9. Pt is due for her emgality now. Will leave a patient a sample to get her through until her appt so she can then discuss changing medications with Clydie Braun.

## 2020-11-16 NOTE — Telephone Encounter (Signed)
-----   Message from Pennie Banter sent at 11/16/2020 10:18 AM EDT ----- Regarding: patient called back

## 2020-11-16 NOTE — Telephone Encounter (Signed)
Attempted to call pt back, left message for to call back if needed

## 2021-01-07 ENCOUNTER — Ambulatory Visit (INDEPENDENT_AMBULATORY_CARE_PROVIDER_SITE_OTHER): Payer: BC Managed Care – PPO | Admitting: Physician Assistant

## 2021-01-07 ENCOUNTER — Other Ambulatory Visit: Payer: Self-pay

## 2021-01-07 ENCOUNTER — Encounter: Payer: Self-pay | Admitting: Physician Assistant

## 2021-01-07 VITALS — BP 123/87 | Wt 155.8 lb

## 2021-01-07 DIAGNOSIS — M62838 Other muscle spasm: Secondary | ICD-10-CM | POA: Diagnosis not present

## 2021-01-07 DIAGNOSIS — G43109 Migraine with aura, not intractable, without status migrainosus: Secondary | ICD-10-CM

## 2021-01-07 DIAGNOSIS — G4489 Other headache syndrome: Secondary | ICD-10-CM

## 2021-01-07 MED ORDER — PROMETHAZINE HCL 12.5 MG RE SUPP: 12.5000 mg | Freq: Four times a day (QID) | RECTAL | 0 refills | Status: AC | PRN

## 2021-01-07 MED ORDER — NURTEC 75 MG PO TBDP: 75.0000 mg | ORAL_TABLET | Freq: Every day | ORAL | 11 refills | Status: DC

## 2021-01-07 MED ORDER — AIMOVIG 70 MG/ML ~~LOC~~ SOAJ: 70.0000 mg | SUBCUTANEOUS | 11 refills | Status: DC

## 2021-01-07 MED ORDER — NURTEC 75 MG PO TBDP: 75.0000 mg | ORAL_TABLET | ORAL | 11 refills | Status: DC | PRN

## 2021-01-07 MED ORDER — RIZATRIPTAN BENZOATE 10 MG PO TABS: 10.0000 mg | ORAL_TABLET | ORAL | 6 refills | Status: AC | PRN

## 2021-01-07 NOTE — Progress Notes (Signed)
History:  [name] is a 40 y.o. [phone] who presents to clinic today for headache eval.  Emgality is no longer covered by insurance but they will cover aimovig.  She is frustrated because Emgality has worked for 3 years but she is agreeable to try aimovig now.  She trialled nurtec after samples were provided last visit.  Her insurance denied that also.  She would like to try again this time to see if they will now approve nurtec.  Rizatriptan has been good - less side effects than she had with imitrex. Flexeril does not help.  HIT6:57 Number of days in the last 4 weeks with:  Severe headache: 2 Moderate headache: 9 Mild headache: 7  No headache: 10   Past Medical History:  Diagnosis Date   Anemia    Anxiety    no medication   Endometritis following delivery 12/25/2012   Kidney disease    one one kidney not functioning well per patient - age 72 yrs, no current problems   Migraine    Seasonal allergies     Social History   Socioeconomic History   Marital status: Married    Spouse name: Not on file   Number of children: Not on file   Years of education: Not on file   Highest education level: Not on file  Occupational History   Not on file  Tobacco Use   Smoking status: Every Day    Packs/day: 0.25    Years: 20.00    Pack years: 5.00    Types: Cigarettes   Smokeless tobacco: Never  Vaping Use   Vaping Use: Never used  Substance and Sexual Activity   Alcohol use: Yes    Comment: occasional    Drug use: No   Sexual activity: Yes    Partners: Male    Birth control/protection: Surgical    Comment: BTL   Other Topics Concern   Not on file  Social History Narrative   Not on file   Social Determinants of Health   Financial Resource Strain: Not on file  Food Insecurity: Not on file  Transportation Needs: Not on file  Physical Activity: Not on file  Stress: Not on file  Social Connections: Not on file  Intimate Partner Violence: Not on file    Family History   Problem Relation Age of Onset   Hypertension Mother    Cervical cancer Mother        Precancerous   Breast cancer Other     Allergies  Allergen Reactions   Percocet [Oxycodone-Acetaminophen] Nausea And Vomiting    Current Outpatient Medications on File Prior to Visit  Medication Sig Dispense Refill   acetaminophen (TYLENOL) 500 MG tablet Take 1,000 mg by mouth every 6 (six) hours as needed for headache.     Aspirin-Acetaminophen-Caffeine (GOODY HEADACHE PO) Take 2 packets by mouth 2 (two) times daily as needed (MIGRAINES).     cyclobenzaprine (FLEXERIL) 10 MG tablet Take 1 tablet (10 mg total) by mouth every 8 (eight) hours as needed (headache/muscle spasm). (Patient not taking: Reported on 01/23/2020) 40 tablet 1   Galcanezumab-gnlm (EMGALITY) 120 MG/ML SOAJ Inject 120 mg into the skin every 30 (thirty) days. 1 mL 11   ibuprofen (ADVIL,MOTRIN) 200 MG tablet Take 800 mg by mouth every 6 (six) hours as needed for headache.     promethazine (PHENERGAN) 12.5 MG suppository Place 1 suppository (12.5 mg total) rectally every 6 (six) hours as needed for nausea or vomiting. 12 each 0  Rimegepant Sulfate (NURTEC) 75 MG TBDP Take 75 mg by mouth as needed (headache). 8 tablet 11   rizatriptan (MAXALT) 10 MG tablet Take 1 tablet (10 mg total) by mouth as needed for migraine. May repeat in 2 hours if needed 12 tablet 6   No current facility-administered medications on file prior to visit.     Review of Systems:  All pertinent positive/negative included in HPI, all other review of systems are negative   Objective:  Physical Exam There were no vitals taken for this visit. CONSTITUTIONAL: Well-developed, well-nourished female in no acute distress.  EYES: EOM intact ENT: Normocephalic CARDIOVASCULAR: Regular rate  RESPIRATORY: Normal rate.  MUSCULOSKELETAL: Normal ROM, SKIN: Warm, dry without erythema  NEUROLOGICAL: Alert, oriented, CN II-XII grossly intact, Appropriate balance PSYCH:  Normal behavior, mood   Assessment & Plan:  Assessment: 1. Headache associated with hormonal factors   2. Muscle spasm   3. Migraine with aura and without status migrainosus, not intractable      Plan: Will trial Aimovig - per insurance.  If 70mg  is ineffective, we cn try increasing to 140mg .  Nurtec prn migraine.  Will send to [facility] pharmacy to get this filled. Rizatriptan prn  Follow-up in 12 months or sooner PRN  , PA-C 01/07/2021 10:44 AM

## 2021-01-07 NOTE — Patient Instructions (Signed)
Migraine Headache A migraine headache is a very strong throbbing pain on one side or both sides of your head. This type of headache can also cause other symptoms. It can last from 4 hours to 3 days. Talk with your doctor about what things may bring on (trigger) this condition. What are the causes? The exact cause of this condition is not known. This condition may be triggered or caused by: Drinking alcohol. Smoking. Taking medicines, such as: Medicine used to treat chest pain (nitroglycerin). Birth control pills. Estrogen. Some blood pressure medicines. Eating or drinking certain products. Doing physical activity. Other things that may trigger a migraine headache include: Having a menstrual period. Pregnancy. Hunger. Stress. Not getting enough sleep or getting too much sleep. Weather changes. Tiredness (fatigue). What increases the risk? Being 25-55 years old. Being female. Having a family history of migraine headaches. Being Caucasian. Having depression or anxiety. Being very overweight. What are the signs or symptoms? A throbbing pain. This pain may: Happen in any area of the head, such as on one side or both sides. Make it hard to do daily activities. Get worse with physical activity. Get worse around bright lights or loud noises. Other symptoms may include: Feeling sick to your stomach (nauseous). Vomiting. Dizziness. Being sensitive to bright lights, loud noises, or smells. Before you get a migraine headache, you may get warning signs (an aura). An aura may include: Seeing flashing lights or having blind spots. Seeing bright spots, halos, or zigzag lines. Having tunnel vision or blurred vision. Having numbness or a tingling feeling. Having trouble talking. Having weak muscles. Some people have symptoms after a migraine headache (postdromal phase), such as: Tiredness. Trouble thinking (concentrating). How is this treated? Taking medicines that: Relieve  pain. Relieve the feeling of being sick to your stomach. Prevent migraine headaches. Treatment may also include: Having acupuncture. Avoiding foods that bring on migraine headaches. Learning ways to control your body functions (biofeedback). Therapy to help you know and deal with negative thoughts (cognitive behavioral therapy). Follow these instructions at home: Medicines Take over-the-counter and prescription medicines only as told by your doctor. Ask your doctor if the medicine prescribed to you: Requires you to avoid driving or using heavy machinery. Can cause trouble pooping (constipation). You may need to take these steps to prevent or treat trouble pooping: Drink enough fluid to keep your pee (urine) pale yellow. Take over-the-counter or prescription medicines. Eat foods that are high in fiber. These include beans, whole grains, and fresh fruits and vegetables. Limit foods that are high in fat and sugar. These include fried or sweet foods. Lifestyle Do not drink alcohol. Do not use any products that contain nicotine or tobacco, such as cigarettes, e-cigarettes, and chewing tobacco. If you need help quitting, ask your doctor. Get at least 8 hours of sleep every night. Limit and deal with stress. General instructions   Keep a journal to find out what may bring on your migraine headaches. For example, write down: What you eat and drink. How much sleep you get. Any change in what you eat or drink. Any change in your medicines. If you have a migraine headache: Avoid things that make your symptoms worse, such as bright lights. It may help to lie down in a dark, quiet room. Do not drive or use heavy machinery. Ask your doctor what activities are safe for you. Keep all follow-up visits as told by your doctor. This is important. Contact a doctor if: You get a migraine headache that   is different or worse than others you have had. You have more than 15 headache days in one  month. Get help right away if: Your migraine headache gets very bad. Your migraine headache lasts longer than 72 hours. You have a fever. You have a stiff neck. You have trouble seeing. Your muscles feel weak or like you cannot control them. You start to lose your balance a lot. You start to have trouble walking. You pass out (faint). You have a seizure. Summary A migraine headache is a very strong throbbing pain on one side or both sides of your head. These headaches can also cause other symptoms. This condition may be treated with medicines and changes to your lifestyle. Keep a journal to find out what may bring on your migraine headaches. Contact a doctor if you get a migraine headache that is different or worse than others you have had. Contact your doctor if you have more than 15 headache days in a month. This information is not intended to replace advice given to you by your health care provider. Make sure you discuss any questions you have with your health care provider. Document Revised: 08/09/2018 Document Reviewed: 05/30/2018 Elsevier Patient Education  2022 [employer]  

## 2021-01-18 ENCOUNTER — Encounter: Payer: Self-pay | Admitting: *Deleted

## 2021-02-02 ENCOUNTER — Other Ambulatory Visit: Payer: Self-pay | Admitting: Physician Assistant

## 2021-02-02 ENCOUNTER — Telehealth: Payer: Self-pay | Admitting: *Deleted

## 2021-02-02 MED ORDER — AIMOVIG 140 MG/ML ~~LOC~~ SOAJ: 140.0000 mg | SUBCUTANEOUS | 11 refills | Status: AC

## 2021-02-02 NOTE — Telephone Encounter (Signed)
Pt requesting to increase her Aimovig to 140mg , okay per PA-C to increase it. Will send in new RX.

## 2021-02-07 ENCOUNTER — Encounter: Payer: Self-pay | Admitting: *Deleted

## 2021-02-07 ENCOUNTER — Ambulatory Visit (INDEPENDENT_AMBULATORY_CARE_PROVIDER_SITE_OTHER): Payer: BC Managed Care – PPO | Admitting: Obstetrics and Gynecology

## 2021-02-07 VITALS — BP 138/94 | HR 94 | Wt 160.8 lb

## 2021-02-07 DIAGNOSIS — Z1231 Encounter for screening mammogram for malignant neoplasm of breast: Secondary | ICD-10-CM

## 2021-02-07 DIAGNOSIS — N760 Acute vaginitis: Secondary | ICD-10-CM | POA: Diagnosis not present

## 2021-02-07 DIAGNOSIS — B3731 Acute candidiasis of vulva and vagina: Secondary | ICD-10-CM | POA: Diagnosis not present

## 2021-02-07 DIAGNOSIS — B9689 Other specified bacterial agents as the cause of diseases classified elsewhere: Secondary | ICD-10-CM

## 2021-02-07 DIAGNOSIS — Z01419 Encounter for gynecological examination (general) (routine) without abnormal findings: Secondary | ICD-10-CM

## 2021-02-07 MED ORDER — METRONIDAZOLE 500 MG PO TABS: 500.0000 mg | ORAL_TABLET | Freq: Two times a day (BID) | ORAL | 0 refills | Status: AC

## 2021-02-07 NOTE — Progress Notes (Signed)
Obstetrics and Gynecology Annual Patient Evaluation  Appointment Date: 02/07/2021  OBGYN Clinic: [facility]  Primary Care Provider: [facility] Chief Complaint:  Chief Complaint  Patient presents with   Gynecologic Exam    History of Present Illness: [name] is a 40 y.o. [id number] (Patient's last menstrual period was 02/01/2021 (exact date).), seen for the above chief complaint. PMHx significant for h/o migraines, h/o BTL, ?focal adenomyosis vs fibroid on u/s  Periods still 4 days but heavier for the first two with some clots, no irregular bleeding, pain the same although she does have some occasional cramping outside of her period.   Review of Systems: Pertinent items noted in HPI and remainder of comprehensive ROS otherwise negative.   Patient Active Problem List   Diagnosis Date Noted   Low vitamin D level 03/17/2020   Hypertriglyceridemia 03/17/2020   Hyperlipidemia 03/17/2020   Elevated ALT measurement 03/17/2020   Obesity (BMI 30-39.9) 03/17/2020   Neck pain 09/22/2016   Muscle spasm 09/05/2016   Headache associated with hormonal factors 07/14/2016   Migraine with aura    SORE THROAT 09/14/2009   URI 06/19/2008   HORDEOLUM 08/12/2007   MIGRAINE HEADACHE 05/31/2007   HEADACHE 05/31/2007     Past Medical History:  Past Medical History:  Diagnosis Date   Anemia    Anxiety    no medication   Endometritis following delivery 12/25/2012   Kidney disease    one one kidney not functioning well per patient - age 20 yrs, no current problems   Migraine    Seasonal allergies     Past Surgical History:  Past Surgical History:  Procedure Laterality Date   APPENDECTOMY     EYE SURGERY Right    removed cyst bottom lid - age 45 yrs   IUD REMOVAL N/A 09/07/2016   Procedure: INTRAUTERINE DEVICE (IUD) REMOVAL;  Surgeon: Adam Phenix, MD;  Location: [facility];  Service: Gynecology;  Laterality: N/A;   LAPAROSCOPIC TUBAL LIGATION  Bilateral 09/07/2016   Procedure: LAPAROSCOPIC TUBAL LIGATION;  Surgeon: Adam Phenix, MD;  Location: [facility];  Service: Gynecology;  Laterality: Bilateral;    Past Obstetrical History:  OB History  Gravida Para Term Preterm AB Living  5 4 4  0 1 4  SAB IAB Ectopic Multiple Live Births  1 0 0 0 4    # Outcome Date GA Lbr Len/2nd Weight Sex Delivery Anes PTL Lv  5 Term 12/19/12 [redacted]w[redacted]d 03:04 / 00:06 5 lb 4.8 oz (2.404 kg) F Vag-Spont EPI  LIV  4 SAB           3 Term           2 Term           1 Term             Obstetric Comments  SVD x 4    Past Gynecological History: As per HPI. History of Pap Smear(s): Yes.   Last pap 2021, which was pap and hpv neg  Social History:  Social History   Socioeconomic History   Marital status: Married    Spouse name: Not on file   Number of children: Not on file   Years of education: Not on file   Highest education level: Not on file  Occupational History   Not on file  Tobacco Use   Smoking status: Every Day    Packs/day: 0.25    Years: 20.00    Pack years: 5.00  Types: Cigarettes   Smokeless tobacco: Never  Vaping Use   Vaping Use: Never used  Substance and Sexual Activity   Alcohol use: Yes    Comment: occasional    Drug use: No   Sexual activity: Yes    Partners: Male    Birth control/protection: Surgical    Comment: BTL   Other Topics Concern   Not on file  Social History Narrative   Not on file   Social Determinants of Health   Financial Resource Strain: Not on file  Food Insecurity: Not on file  Transportation Needs: Not on file  Physical Activity: Not on file  Stress: Not on file  Social Connections: Not on file  Intimate Partner Violence: Not on file    Family History:  Family History  Problem Relation Age of Onset   Hypertension Mother    Cervical cancer Mother        Precancerous   Breast cancer Other   Mother getting a breast cancer work up; that mother's mother had breast cancer.    Medications [name] had no medications administered during this visit. Current Outpatient Medications  Medication Sig Dispense Refill   acetaminophen (TYLENOL) 500 MG tablet Take 1,000 mg by mouth every 6 (six) hours as needed for headache.     Aspirin-Acetaminophen-Caffeine (GOODY HEADACHE PO) Take 2 packets by mouth 2 (two) times daily as needed (MIGRAINES).     Erenumab-aooe (AIMOVIG) 140 MG/ML SOAJ Inject 140 mg into the skin every 30 (thirty) days. 1.12 mL 11   ibuprofen (ADVIL,MOTRIN) 200 MG tablet Take 800 mg by mouth every 6 (six) hours as needed for headache.     promethazine (PHENERGAN) 12.5 MG suppository Place 1 suppository (12.5 mg total) rectally every 6 (six) hours as needed for nausea or vomiting. 12 each 0   Rimegepant Sulfate (NURTEC) 75 MG TBDP Take 75 mg by mouth daily. 8 tablet 11   rizatriptan (MAXALT) 10 MG tablet Take 1 tablet (10 mg total) by mouth as needed for migraine. May repeat in 2 hours if needed 12 tablet 6   No current facility-administered medications for this visit.    Allergies Percocet [oxycodone-acetaminophen]   Physical Exam:  BP (!) 138/94 (BP Location: Left Arm, Patient Position: Sitting, Cuff Size: Normal)   Pulse 94   Wt 160 lb 12.8 oz (72.9 kg)   LMP 02/01/2021 (Exact Date)   BMI 28.48 kg/m  Body mass index is 28.48 kg/m. General appearance: Well nourished, well developed female in no acute distress.  Neck:  Supple, normal appearance, and no thyromegaly  Cardiovascular: normal s1 and s2.  No murmurs, rubs or gallops. Respiratory:  Clear to auscultation bilateral. Normal respiratory effort Abdomen: positive bowel sounds and no masses, hernias; diffusely non tender to palpation, non distended Breasts: breasts appear normal, no suspicious masses, no skin or nipple changes or axillary nodes, and normal palpation. Neuro/Psych:  Normal mood and affect.  Skin:  Warm and dry.  Lymphatic:  No inguinal lymphadenopathy.   Pelvic exam:  is not limited by body habitus EGBUS: mild erythema on b/l labia and at the perineum Vagina: white, cottage cheese like d/c in vault Cervix: normal appearing cervix without tenderness, discharge or lesions. Uterus:  nonenlarged and non tender Adnexa:  normal adnexa and no mass, fullness, tenderness Rectovaginal: deferred  Laboratory: none  Radiology: none  Assessment: pt stable  Plan:  1. Well woman exam with routine gynecological exam Set up for screening mammo starting at age 52. I d/w  her that if she's interested in talking to genetics due to her FHx of breast cancer to let us know to arrange that for her. Recommend pap in 2024-26 - MM 3D SCREEN BREAST BILATERAL; Future  2. Encounter for screening mammogram for malignant neoplasm of breast - MM 3D SCREEN BREAST BILATERAL; Future  3. Bacterial vaginosis Pt states she just finished abx for a UTI and she has flagyl but hadn't taken it b/c was asymptomatic. Recommend she take the two doses q72h she already has and flagyl sent in for her  4. Vulvovaginal candidiasis RTC PRN  Cornelia Copa MD Attending Center for [facility])

## 2021-02-11 ENCOUNTER — Encounter: Payer: Self-pay | Admitting: Radiology

## 2021-03-28 ENCOUNTER — Ambulatory Visit
Admission: RE | Admit: 2021-03-28 | Discharge: 2021-03-28 | Disposition: A | Discharge status: Home / Self Care | Payer: BC Managed Care – PPO | Source: Ambulatory Visit | Attending: Obstetrics and Gynecology | Admitting: Obstetrics and Gynecology

## 2021-03-28 ENCOUNTER — Other Ambulatory Visit: Payer: Self-pay

## 2021-03-28 DIAGNOSIS — Z1231 Encounter for screening mammogram for malignant neoplasm of breast: Secondary | ICD-10-CM | POA: Diagnosis not present

## 2021-03-28 DIAGNOSIS — Z01419 Encounter for gynecological examination (general) (routine) without abnormal findings: Secondary | ICD-10-CM

## 2021-07-18 ENCOUNTER — Other Ambulatory Visit: Payer: Self-pay

## 2021-07-18 ENCOUNTER — Encounter (HOSPITAL_BASED_OUTPATIENT_CLINIC_OR_DEPARTMENT_OTHER): Payer: Self-pay | Admitting: Emergency Medicine

## 2021-07-18 ENCOUNTER — Emergency Department (HOSPITAL_BASED_OUTPATIENT_CLINIC_OR_DEPARTMENT_OTHER)
Admission: EM | Admit: 2021-07-18 | Discharge: 2021-07-18 | Disposition: A | Discharge status: Home / Self Care | Payer: BC Managed Care – PPO | Attending: Emergency Medicine | Admitting: Emergency Medicine

## 2021-07-18 DIAGNOSIS — H6091 Unspecified otitis externa, right ear: Secondary | ICD-10-CM | POA: Diagnosis not present

## 2021-07-18 DIAGNOSIS — J020 Streptococcal pharyngitis: Secondary | ICD-10-CM | POA: Diagnosis not present

## 2021-07-18 DIAGNOSIS — H60501 Unspecified acute noninfective otitis externa, right ear: Secondary | ICD-10-CM | POA: Insufficient documentation

## 2021-07-18 DIAGNOSIS — I10 Essential (primary) hypertension: Secondary | ICD-10-CM | POA: Insufficient documentation

## 2021-07-18 DIAGNOSIS — J029 Acute pharyngitis, unspecified: Secondary | ICD-10-CM | POA: Diagnosis not present

## 2021-07-18 LAB — BASIC METABOLIC PANEL
Anion gap: 10 (ref 5–15)
BUN: 13 mg/dL (ref 6–20)
CO2: 25 mmol/L (ref 22–32)
Calcium: 9.3 mg/dL (ref 8.9–10.3)
Chloride: 101 mmol/L (ref 98–111)
Creatinine, Ser: 0.58 mg/dL (ref 0.44–1.00)
GFR, Estimated: >60 mL/min (ref >60)
Glucose, Bld: 99 mg/dL (ref 70–99)
Potassium: 3.7 mmol/L (ref 3.5–5.1)
Sodium: 136 mmol/L (ref 135–145)

## 2021-07-18 LAB — GROUP A STREP BY PCR: Group A Strep by PCR: DETECTED — AB

## 2021-07-18 MED ORDER — CLINDAMYCIN PHOSPHATE 300 MG/50ML IV SOLN
300.0000 mg | Freq: Once | INTRAVENOUS | Status: AC
  Administered 2021-07-18: 300 mg via INTRAVENOUS
  Filled 2021-07-18: qty 50

## 2021-07-18 MED ORDER — DEXAMETHASONE SODIUM PHOSPHATE 10 MG/ML IJ SOLN
10.0000 mg | Freq: Once | INTRAMUSCULAR | Status: AC
  Administered 2021-07-18: 10 mg via INTRAVENOUS
  Filled 2021-07-18: qty 1

## 2021-07-18 MED ORDER — CLINDAMYCIN HCL 150 MG PO CAPS: 300.0000 mg | ORAL_CAPSULE | Freq: Four times a day (QID) | ORAL | 0 refills | Status: AC

## 2021-07-18 MED ORDER — FLUTICASONE PROPIONATE 50 MCG/ACT NA SUSP
2.0000 | Freq: Every day | NASAL | 0 refills | Status: AC
Start: 2021-07-18 — End: 2021-08-01

## 2021-07-18 MED ORDER — NEOMYCIN-POLYMYXIN-HC 3.5-10000-1 OT SUSP: 4.0000 [drp] | Freq: Three times a day (TID) | OTIC | 0 refills | Status: AC

## 2021-07-18 MED ORDER — FLUCONAZOLE 200 MG PO TABS: 200.0000 mg | ORAL_TABLET | ORAL | 0 refills | Status: AC

## 2021-07-18 MED ORDER — SODIUM CHLORIDE 0.9 % IV BOLUS
1000.0000 mL | Freq: Once | INTRAVENOUS | Status: AC
  Administered 2021-07-18: 1000 mL via INTRAVENOUS

## 2021-07-18 MED ORDER — KETOROLAC TROMETHAMINE 15 MG/ML IJ SOLN
15.0000 mg | Freq: Once | INTRAMUSCULAR | Status: AC
Start: 2021-07-18 — End: 2021-07-18
  Administered 2021-07-18: 15 mg via INTRAVENOUS
  Filled 2021-07-18: qty 1

## 2021-07-18 NOTE — Discharge Instructions (Addendum)
Please gargle warm salt water and take Tylenol and ibuprofen as discussed below.  Drink plenty of water and take the antibiotic clindamycin as prescribed for the entire course.  Please follow-up with the ear nose and throat doctor I given you the information for their office I recommend you call today to make an a follow-up appointment tomorrow or the next day. ?

## 2021-07-18 NOTE — ED Triage Notes (Signed)
R side sore throat since last night. She is concerned she has a peritonsillar abscess. Hx of same.  ?

## 2021-07-18 NOTE — ED Provider Notes (Addendum)
?[facility] EMERGENCY DEPT ?Provider Note ? ? ?CSN: [id number] ?Arrival date & time: 07/18/21  1145 ? ?  ? ?History ? ?Chief Complaint  ?Patient presents with  ? Sore Throat  ? ? ?Sarah MoraleJennifer [name] is a 41 y.o. female. ? ? ?Sore Throat ? ?Patient is a 41 year old female with a past medical history significant for migraines, HLD, HTN, obesity, sore throat ? ?She presents to the ER today with sore throat since last night she states she is a history of peritonsillar abscess and states that she feels that she has 1 today.  She denies any nausea vomiting diarrhea no chest pain difficulty breathing.  No fevers at home.  No other associate symptoms. ? ?She states she has had a peritonsillar abscess in the past and required incision and drainage by an ear nose and throat doctor ? ?  ? ?Home Medications ?Prior to Admission medications   ?Medication Sig Start Date End Date Taking? Authorizing Provider  ?clindamycin (CLEOCIN) 150 MG capsule Take 2 capsules (300 mg total) by mouth 4 (four) times daily for 7 days. 07/18/21 07/25/21 Yes Yemaya Barnier, Rodrigo RanWylder S, PA  ?fluconazole (DIFLUCAN) 200 MG tablet Take 1 tablet (200 mg total) by mouth once a week for 2 doses. Take 1 tablet p.o., if you continue to have symptoms at the 1 week.  You may take a second tablet. 07/18/21 07/26/21 Yes Josejulian Tarango, Rodrigo RanWylder S, PA  ?fluticasone (FLONASE) 50 MCG/ACT nasal spray Place 2 sprays into both nostrils daily for 14 days. 07/18/21 08/01/21 Yes Salvadore Valvano, Rodrigo RanWylder S, PA  ?neomycin-polymyxin-hydrocortisone (CORTISPORIN) 3.5-10000-1 OTIC suspension Place 4 drops into the right ear 3 (three) times daily for 7 days. 07/18/21 07/25/21 Yes Gailen ShelterFondaw, Ayron Fillinger S, PA  ?acetaminophen (TYLENOL) 500 MG tablet Take 1,000 mg by mouth every 6 (six) hours as needed for headache.    [provider]  ?Aspirin-Acetaminophen-Caffeine (GOODY HEADACHE PO) Take 2 packets by mouth 2 (two) times daily as needed (MIGRAINES).    [provider]  ?Erenumab-aooe (AIMOVIG) 140  MG/ML SOAJ Inject 140 mg into the skin every 30 (thirty) days. 02/02/21   Glyn Adeeague Clark, Scot JunKaren E, PA-C  ?ibuprofen (ADVIL,MOTRIN) 200 MG tablet Take 800 mg by mouth every 6 (six) hours as needed for headache.    [provider]  ?promethazine (PHENERGAN) 12.5 MG suppository Place 1 suppository (12.5 mg total) rectally every 6 (six) hours as needed for nausea or vomiting. 01/07/21   Bertram Denvereague Clark, Karen E, PA-C  ?Rimegepant Sulfate (NURTEC) 75 MG TBDP Take 75 mg by mouth daily. 01/07/21   Glyn Adeeague Clark, Scot JunKaren E, PA-C  ?rizatriptan (MAXALT) 10 MG tablet Take 1 tablet (10 mg total) by mouth as needed for migraine. May repeat in 2 hours if needed 01/07/21   Glyn Adeeague Clark, Scot JunKaren E, PA-C  ?   ? ?Allergies    ?Percocet [oxycodone-acetaminophen]   ? ?Review of Systems   ?Review of Systems ? ?Physical Exam ?Updated Vital Signs ?BP 135/83   Pulse 88   Temp 98.4 ?F (36.9 ?C)   Resp 18   Ht 5\' 3"  (1.6 m)   Wt 71.7 kg   LMP 06/23/2021   SpO2 100%   BMI 27.99 kg/m?  ?Physical Exam ?Vitals and nursing note reviewed.  ?Constitutional:   ?   General: She is not in acute distress. ?HENT:  ?   Head: Normocephalic and atraumatic.  ?   Right Ear: Tympanic membrane normal.  ?   Ears:  ?   Comments: Right EAC with macerated tissue  and some swelling ?   Nose: Nose normal.  ?   Mouth/Throat:  ?   Mouth: Mucous membranes are moist.  ?   Comments: Uvula midline, normal phonation, managing secretions, posterior pharyngeal erythema some right-sided peritonsillar swelling of the tonsillar pillar ?Eyes:  ?   General: No scleral icterus. ?Cardiovascular:  ?   Rate and Rhythm: Normal rate and regular rhythm.  ?   Pulses: Normal pulses.  ?   Heart sounds: Normal heart sounds.  ?Pulmonary:  ?   Effort: Pulmonary effort is normal. No respiratory distress.  ?   Breath sounds: No wheezing.  ?Abdominal:  ?   Palpations: Abdomen is soft.  ?   Tenderness: There is no abdominal tenderness.  ?Musculoskeletal:  ?   Cervical back: Normal range of motion.   ?   Right lower leg: No edema.  ?   Left lower leg: No edema.  ?Skin: ?   General: Skin is warm and dry.  ?   Capillary Refill: Capillary refill takes less than 2 seconds.  ?Neurological:  ?   Mental Status: She is alert. Mental status is at baseline.  ?Psychiatric:     ?   Mood and Affect: Mood normal.     ?   Behavior: Behavior normal.  ? ? ?ED Results / Procedures / Treatments   ?Labs ?(all labs ordered are listed, but only abnormal results are displayed) ?Labs Reviewed  ?GROUP A STREP BY PCR - Abnormal; Notable for the following components:  ?    Result Value  ? Group A Strep by PCR DETECTED (*)   ? All other components within normal limits  ?BASIC METABOLIC PANEL  ? ? ?EKG ?None ? ?Radiology ?No results found. ? ?Procedures ?Procedures  ? ? ?Medications Ordered in ED ?Medications  ?sodium chloride 0.9 % bolus 1,000 mL (0 mLs Intravenous Stopped 07/18/21 1547)  ?dexamethasone (DECADRON) injection 10 mg (10 mg Intravenous Given 07/18/21 1355)  ?clindamycin (CLEOCIN) IVPB 300 mg (0 mg Intravenous Stopped 07/18/21 1500)  ?ketorolac (TORADOL) 15 MG/ML injection 15 mg (15 mg Intravenous Given 07/18/21 1455)  ? ? ?ED Course/ Medical Decision Making/ A&P ?  ?                        ?Medical Decision Making ?Amount and/or Complexity of Data Reviewed ?Labs: ordered. ? ?Risk ?Prescription drug management. ? ? ?This patient presents to the ED for concern of sore throat, this involves a number of treatment options, and is a complaint that carries with it a moderate risk of complications and morbidity.  The differential diagnosis includes sore throat, Ludewig's angina, strep pharyngitis, peritonsillar abscess, retropharyngeal abscess, mono ? ? ?Co morbidities: ?Discussed in HPI ? ? ?Brief History: ? ?Patient is a 41 year old female with a past medical history significant for migraines, HLD, HTN, obesity, sore throat ? ?She presents to the ER today with sore throat since last night she states she is a history of peritonsillar  abscess and states that she feels that she has 1 today.  She denies any nausea vomiting diarrhea no chest pain difficulty breathing.  No fevers at home.  No other associate symptoms. ? ?She states she has had a peritonsillar abscess in the past and required incision and drainage by an ear nose and throat doctor ? ? ?No fevers at home.  Well-appearing on exam some peritonsillar swelling uvula midline normal phonation no hot potato voice managing secretions breathing without difficulty. ? ?We will  treat with steroids and clindamycin Toradol and fluids anticipate discharge home with follow-up with ear nose and throat ? ?EMR reviewed including pt PMHx, past surgical history and past visits to ER.  ? ?See HPI for more details ? ? ?Lab Tests: ? ? ?I personally evaluated all labs BMP unremarkable group a strep PCR positive ? ? ?Imaging Studies: ? ?No imaging studies ordered for this patient ? ? ? ?Cardiac Monitoring: ? ?NA ?NA ? ? ?Medicines ordered: ? ?I ordered medication including normal saline, clindamycin, Toradol, Decadron for peritonsillar abscess/strep ?Reevaluation of the patient after these medicines showed that the patient improved ?I have reviewed the patients home medicines and have made adjustments as needed ? ? ?Critical Interventions: ? ? ? ? ?Consults/Attending Physician ? ? ? ? ? ?Reevaluation: ? ?After the interventions noted above I re-evaluated patient and found that they have :improved ? ? ?Social Determinants of Health: ? ?The patient's social determinants of health were a factor in the care of this patient ? ? ? ?Problem List / ED Course: ? ?Strep throat complicated by peritonsillar abscess versus peritonsillar swelling ?Otitis externa ? ?Managing secretions normal voice feeling much improved after fluids steroids and Toradol.  She will need to follow-up with ear nose and throat.  Return precautions were given. ? ? ?Dispostion: ? ?After consideration of the diagnostic results and the patients  response to treatment, I feel that the patent would benefit from close outpatient follow-up. ?  ? ? ? ? ?Final Clinical Impression(s) / ED Diagnoses ?Final diagnoses:  ?Strep pharyngitis  ?Acute otitis externa of ri

## 2021-07-19 DIAGNOSIS — J039 Acute tonsillitis, unspecified: Secondary | ICD-10-CM | POA: Diagnosis not present

## 2021-08-26 ENCOUNTER — Encounter: Payer: Self-pay | Admitting: *Deleted

## 2021-12-21 DIAGNOSIS — H6690 Otitis media, unspecified, unspecified ear: Secondary | ICD-10-CM | POA: Diagnosis not present

## 2021-12-21 DIAGNOSIS — H60501 Unspecified acute noninfective otitis externa, right ear: Secondary | ICD-10-CM | POA: Diagnosis not present

## 2023-05-23 ENCOUNTER — Emergency Department (HOSPITAL_COMMUNITY)
Admission: EM | Admit: 2023-05-23 | Discharge: 2023-05-23 | Disposition: A | Discharge status: Home / Self Care | Payer: Medicaid Other | Attending: Emergency Medicine | Admitting: Emergency Medicine

## 2023-05-23 ENCOUNTER — Encounter (HOSPITAL_COMMUNITY): Payer: Self-pay | Admitting: Emergency Medicine

## 2023-05-23 ENCOUNTER — Other Ambulatory Visit: Payer: Self-pay

## 2023-05-23 DIAGNOSIS — F10921 Alcohol use, unspecified with intoxication delirium: Secondary | ICD-10-CM | POA: Diagnosis not present

## 2023-05-23 DIAGNOSIS — Y906 Blood alcohol level of 120-199 mg/100 ml: Secondary | ICD-10-CM | POA: Diagnosis not present

## 2023-05-23 DIAGNOSIS — R799 Abnormal finding of blood chemistry, unspecified: Secondary | ICD-10-CM | POA: Insufficient documentation

## 2023-05-23 DIAGNOSIS — T50901A Poisoning by unspecified drugs, medicaments and biological substances, accidental (unintentional), initial encounter: Secondary | ICD-10-CM | POA: Insufficient documentation

## 2023-05-23 DIAGNOSIS — X58XXXA Exposure to other specified factors, initial encounter: Secondary | ICD-10-CM | POA: Diagnosis not present

## 2023-05-23 DIAGNOSIS — T6591XA Toxic effect of unspecified substance, accidental (unintentional), initial encounter: Secondary | ICD-10-CM

## 2023-05-23 LAB — COMPREHENSIVE METABOLIC PANEL
ALT: 43 U/L (ref 0–44)
AST: 31 U/L (ref 15–41)
Albumin: 4.5 g/dL (ref 3.5–5.0)
Alkaline Phosphatase: 48 U/L (ref 38–126)
Anion gap: 13 (ref 5–15)
BUN: 10 mg/dL (ref 6–20)
CO2: 18 mmol/L — ABNORMAL LOW (ref 22–32)
Calcium: 9.3 mg/dL (ref 8.9–10.3)
Chloride: 107 mmol/L (ref 98–111)
Creatinine, Ser: 0.55 mg/dL (ref 0.44–1.00)
GFR, Estimated: >60 mL/min (ref >60)
Glucose, Bld: 161 mg/dL — ABNORMAL HIGH (ref 70–99)
Potassium: 3.5 mmol/L (ref 3.5–5.1)
Sodium: 138 mmol/L (ref 135–145)
Total Bilirubin: 0.5 mg/dL (ref 0.0–1.2)
Total Protein: 7.6 g/dL (ref 6.5–8.1)

## 2023-05-23 LAB — CBC
HCT: 46.7 % — ABNORMAL HIGH (ref 36.0–46.0)
Hemoglobin: 16 g/dL — ABNORMAL HIGH (ref 12.0–15.0)
MCH: 30.8 pg (ref 26.0–34.0)
MCHC: 34.3 g/dL (ref 30.0–36.0)
MCV: 89.8 fL (ref 80.0–100.0)
Platelets: 282 10*3/uL (ref 150–400)
RBC: 5.2 MIL/uL — ABNORMAL HIGH (ref 3.87–5.11)
RDW: 12.1 % (ref 11.5–15.5)
WBC: 10.4 10*3/uL (ref 4.0–10.5)
nRBC: 0 % (ref 0.0–0.2)

## 2023-05-23 LAB — CBG MONITORING, ED: Glucose-Capillary: 158 mg/dL — ABNORMAL HIGH (ref 70–99)

## 2023-05-23 LAB — RAPID URINE DRUG SCREEN, HOSP PERFORMED
Amphetamines: NOT DETECTED
Barbiturates: NOT DETECTED
Benzodiazepines: NOT DETECTED
Cocaine: NOT DETECTED
Opiates: NOT DETECTED
Tetrahydrocannabinol: NOT DETECTED

## 2023-05-23 LAB — ETHANOL: Alcohol, Ethyl (B): 118 mg/dL — ABNORMAL HIGH (ref <10)

## 2023-05-23 LAB — HCG, SERUM, QUALITATIVE: Preg, Serum: NEGATIVE

## 2023-05-23 LAB — I-STAT CG4 LACTIC ACID, ED: Lactic Acid, Venous: 1.4 mmol/L (ref 0.5–1.9)

## 2023-05-23 LAB — AMMONIA: Ammonia: 29 umol/L (ref 9–35)

## 2023-05-23 LAB — SALICYLATE LEVEL: Salicylate Lvl: <7 mg/dL — ABNORMAL LOW (ref 7.0–30.0)

## 2023-05-23 LAB — ACETAMINOPHEN LEVEL: Acetaminophen (Tylenol), Serum: <10 ug/mL — ABNORMAL LOW (ref 10–30)

## 2023-05-23 NOTE — ED Provider Notes (Signed)
[place] EMERGENCY DEPARTMENT AT [facility] Provider Note   CSN: [id number] Arrival date & time: 05/23/23  0020     History  Chief Complaint  Patient presents with   Altered Mental Status   Ingestion    [name] is a 43 y.o. female.  43 yo F with a chief complaint of acting abnormally.  The patient reportedly had been drinking alcohol this evening and the husband felt she was not acting appropriately.  She was then brought here for evaluation.  The patient is intoxicated on my exam.  She has no obvious complaints.  Level 5 caveat.   Altered Mental Status Ingestion       Home Medications Prior to Admission medications   Medication Sig Start Date End Date Taking? Authorizing Provider  acetaminophen (TYLENOL) 500 MG tablet Take 1,000 mg by mouth every 6 (six) hours as needed for headache.    [provider]  Aspirin-Acetaminophen-Caffeine (GOODY HEADACHE PO) Take 2 packets by mouth 2 (two) times daily as needed (MIGRAINES).    [provider]  Erenumab-aooe (AIMOVIG) 140 MG/ML SOAJ Inject 140 mg into the skin every 30 (thirty) days. 02/02/21   Glyn Ade, Scot Jun, PA-C  fluticasone (FLONASE) 50 MCG/ACT nasal spray Place 2 sprays into both nostrils daily for 14 days. 07/18/21 08/01/21  Gailen Shelter, PA  ibuprofen (ADVIL,MOTRIN) 200 MG tablet Take 800 mg by mouth every 6 (six) hours as needed for headache.    [provider]  promethazine (PHENERGAN) 12.5 MG suppository Place 1 suppository (12.5 mg total) rectally every 6 (six) hours as needed for nausea or vomiting. 01/07/21   Glyn Ade, Scot Jun, PA-C  Rimegepant Sulfate (NURTEC) 75 MG TBDP Take 75 mg by mouth daily. 01/07/21   Glyn Ade, Scot Jun, PA-C  rizatriptan (MAXALT) 10 MG tablet Take 1 tablet (10 mg total) by mouth as needed for migraine. May repeat in 2 hours if needed 01/07/21   Glyn Ade, Scot Jun, PA-C      Allergies    Percocet [oxycodone-acetaminophen]    Review  of Systems   Review of Systems  Physical Exam Updated Vital Signs BP (!) 132/110   Pulse 79   Temp 97.6 F (36.4 C)   Resp 19   Ht 5\' 3"  (1.6 m)   Wt 71.7 kg   SpO2 100%   BMI 28.00 kg/m  Physical Exam Vitals and nursing note reviewed.  Constitutional:      General: She is not in acute distress.    Appearance: She is well-developed. She is not diaphoretic.     Comments: Clinically intoxicated  HENT:     Head: Normocephalic and atraumatic.  Eyes:     Pupils: Pupils are equal, round, and reactive to light.  Cardiovascular:     Rate and Rhythm: Normal rate and regular rhythm.     Heart sounds: No murmur heard.    No friction rub. No gallop.  Pulmonary:     Effort: Pulmonary effort is normal.     Breath sounds: No wheezing or rales.  Abdominal:     General: There is no distension.     Palpations: Abdomen is soft.     Tenderness: There is no abdominal tenderness.  Musculoskeletal:        General: No tenderness.     Cervical back: Normal range of motion and neck supple.  Skin:    General: Skin is warm and dry.  Neurological:     Mental Status:  She is alert and oriented to person, place, and time.  Psychiatric:        Behavior: Behavior normal.     ED Results / Procedures / Treatments   Labs (all labs ordered are listed, but only abnormal results are displayed) Labs Reviewed  COMPREHENSIVE METABOLIC PANEL - Abnormal; Notable for the following components:      Result Value   CO2 18 (*)    Glucose, Bld 161 (*)    All other components within normal limits  CBC - Abnormal; Notable for the following components:   RBC 5.20 (*)    Hemoglobin 16.0 (*)    HCT 46.7 (*)    All other components within normal limits  ETHANOL - Abnormal; Notable for the following components:   Alcohol, Ethyl (B) 118 (*)    All other components within normal limits  ACETAMINOPHEN LEVEL - Abnormal; Notable for the following components:   Acetaminophen (Tylenol), Serum <10 (*)    All other  components within normal limits  SALICYLATE LEVEL - Abnormal; Notable for the following components:   Salicylate Lvl <7.0 (*)    All other components within normal limits  CBG MONITORING, ED - Abnormal; Notable for the following components:   Glucose-Capillary 158 (*)    All other components within normal limits  HCG, SERUM, QUALITATIVE  RAPID URINE DRUG SCREEN, HOSP PERFORMED  AMMONIA  I-STAT CG4 LACTIC ACID, ED    EKG EKG Interpretation Date/Time:  Wednesday May 23 2023 00:48:42 EST Ventricular Rate:  94 PR Interval:  142 QRS Duration:  78 QT Interval:  350 QTC Calculation: 437 R Axis:   52  Text Interpretation: Normal sinus rhythm Nonspecific T wave abnormality Abnormal ECG No old tracing to compare Confirmed by Melene Plan [phone]) on 05/23/2023 1:25:13 AM  Radiology No results found.  Procedures Procedures    Medications Ordered in ED Medications - No data to display  ED Course/ Medical Decision Making/ A&P                                 Medical Decision Making Amount and/or Complexity of Data Reviewed Labs: ordered.   43 yo F with a chief complaint of acting bizarrely.  This was reported by the husband.  There is some concern that perhaps she took some of her medications at home.  She also reportedly had a bottle and a half of wine this evening.  Patient appears to be intoxicated on my exam.  Will obtain a laboratory evaluation.  Observe in the ED.  Patient seems to have some improvement of her mental status on my repeat exam.  Husband is still concerned feels like that she has been hallucinating.  There is some concern that maybe she had taken metformin or perhaps Topamax.  I added on a lactate level which was normal.  Seems unlikely to be a significant metformin ingestion.  Her alcohol level is elevated.  Could be the cause of some or all of her symptoms.  She continues to be clinically stable.  In my opinion improving.  Discussed this with the  patient and family.  Offered continued observation here in the emergency department setting.  They prefer to take her home at this time.  2:35 AM:  I have discussed the diagnosis/risks/treatment options with the patient.  Evaluation and diagnostic testing in the emergency department does not suggest an emergent condition requiring admission or immediate intervention beyond what  has been performed at this time.  They will follow up with PCP. We also discussed returning to the ED immediately if new or worsening sx occur. We discussed the sx which are most concerning (e.g., sudden worsening pain, fever, inability to tolerate by mouth) that necessitate immediate return. Medications administered to the patient during their visit and any new prescriptions provided to the patient are listed below.  Medications given during this visit Medications - No data to display   The patient appears reasonably screen and/or stabilized for discharge and I doubt any other medical condition or other [facility] requiring further screening, evaluation, or treatment in the ED at this time prior to discharge.          Final Clinical Impression(s) / ED Diagnoses Final diagnoses:  Ingestion of substance, accidental or unintentional, initial encounter  Alcohol intoxication with delirium [facility])    Rx / DC Orders ED Discharge Orders     None         Melene Plan, DO 05/23/23 2725

## 2023-05-23 NOTE — ED Triage Notes (Signed)
Pt here for AMS, pt A&Ox3, disoriented to situation. Husband reports she has been drinking tonight, approx. 1.5 bottles of wine. States that he found her taking pills unsure if it was her metformin or topiramate. States that he made her vomit but that she has become increasingly altered throughout the night. Pt laughing when asked questions, making repetitive statements, when asked what happened pt states "took pills". Husband reports she has not taken these medications in several months.

## 2023-05-23 NOTE — ED Notes (Signed)
Pt becoming agitated and paranoid in triage, appears as if she is searching for something then stating "just take me home". Charge RN made aware.

## 2023-05-23 NOTE — ED Notes (Signed)
CBG 158  

## 2023-05-23 NOTE — Discharge Instructions (Signed)
Typically if this is due to something that was ingested it will progressively get better over time.  Please return to the emergency department for any worsening, if she develops a fever.  Please let your family doctor know in the morning how she is doing.

## 2023-10-17 ENCOUNTER — Telehealth: Admitting: Physician Assistant

## 2023-10-17 DIAGNOSIS — H60332 Swimmer's ear, left ear: Secondary | ICD-10-CM | POA: Diagnosis not present

## 2023-10-17 MED ORDER — CEFUROXIME AXETIL 500 MG PO TABS: 500.0000 mg | ORAL_TABLET | Freq: Two times a day (BID) | ORAL | 0 refills | Status: AC

## 2023-10-17 MED ORDER — CIPROFLOXACIN-DEXAMETHASONE 0.3-0.1 % OT SUSP: OTIC | 0 refills | Status: AC

## 2023-10-17 NOTE — Progress Notes (Signed)
 Virtual Visit Consent   Kaja Jackowski, you are scheduled for a virtual visit with a [facility] provider today. Just as with appointments in the office, your consent must be obtained to participate. Your consent will be active for this visit and any virtual visit you may have with one of our providers in the next 365 days. If you have a MyChart account, a copy of this consent can be sent to you electronically.  As this is a virtual visit, video technology does not allow for your provider to perform a traditional examination. This may limit your provider's ability to fully assess your condition. If your provider identifies any concerns that need to be evaluated in person or the need to arrange testing (such as labs, EKG, etc.), we will make arrangements to do so. Although advances in technology are sophisticated, we cannot ensure that it will always work on either your end or our end. If the connection with a video visit is poor, the visit may have to be switched to a telephone visit. With either a video or telephone visit, we are not always able to ensure that we have a secure connection.  By engaging in this virtual visit, you consent to the provision of healthcare and authorize for your insurance to be billed (if applicable) for the services provided during this visit. Depending on your insurance coverage, you may receive a charge related to this service.  I need to obtain your verbal consent now. Are you willing to proceed with your visit today? [name] has provided verbal consent on 10/17/2023 for a virtual visit (video or telephone). Hyla Maillard, [place]  Date: 10/17/2023 8:23 AM   Virtual Visit via Video Note   I, Hyla Maillard, connected with  [name]  ([id number], 29-Apr-1981) on 10/17/23 at  8:15 AM EDT by a video-enabled telemedicine application and verified that I am speaking with the correct person using two identifiers.  Location: Patient: Virtual Visit Location  Patient: Home Provider: Virtual Visit Location Provider: Home Office   I discussed the limitations of evaluation and management by telemedicine and the availability of in person appointments. The patient expressed understanding and agreed to proceed.    History of Present Illness: [name] is a 43 y.o. who identifies as a female who was assigned female at birth, and is being seen today for L ear pain and swelling over the past few days. Notes pain with wiggling the ear lobe and pushing on the tragus. Notes swelling of earlobe. Some slight drainage that felt watery. Denies fever, chills. Just returned from the beach, swimming.    HPI: HPI  Problems:  Patient Active Problem List   Diagnosis Date Noted   Low vitamin D  level 03/17/2020   Hypertriglyceridemia 03/17/2020   Hyperlipidemia 03/17/2020   Elevated ALT measurement 03/17/2020   Obesity (BMI 30-39.9) 03/17/2020   Neck pain 09/22/2016   Muscle spasm 09/05/2016   Headache associated with hormonal factors 07/14/2016   Migraine with aura    SORE THROAT 09/14/2009   URI 06/19/2008   HORDEOLUM 08/12/2007   MIGRAINE HEADACHE 05/31/2007   HEADACHE 05/31/2007    Allergies:  Allergies  Allergen Reactions   Percocet [Oxycodone -Acetaminophen ] Nausea And Vomiting   Medications:  Current Outpatient Medications:    cefUROXime (CEFTIN) 500 MG tablet, Take 1 tablet (500 mg total) by mouth 2 (two) times daily with a meal for 7 days., Disp: 14 tablet, Rfl: 0   ciprofloxacin-dexamethasone  (CIPRODEX) OTIC suspension, Apply 4 drops to  the affected ear(s) twice daily x 7 days, Disp: 7.5 mL, Rfl: 0   acetaminophen  (TYLENOL ) 500 MG tablet, Take 1,000 mg by mouth every 6 (six) hours as needed for headache., Disp: , Rfl:    Aspirin-Acetaminophen -Caffeine (GOODY HEADACHE PO), Take 2 packets by mouth 2 (two) times daily as needed (MIGRAINES)., Disp: , Rfl:    Erenumab -aooe (AIMOVIG ) 140 MG/ML SOAJ, Inject 140 mg into the skin every 30 (thirty)  days., Disp: 1.12 mL, Rfl: 11   ibuprofen  (ADVIL ,MOTRIN ) 200 MG tablet, Take 800 mg by mouth every 6 (six) hours as needed for headache., Disp: , Rfl:    promethazine  (PHENERGAN ) 12.5 MG suppository, Place 1 suppository (12.5 mg total) rectally every 6 (six) hours as needed for nausea or vomiting., Disp: 12 each, Rfl: 0   rizatriptan  (MAXALT ) 10 MG tablet, Take 1 tablet (10 mg total) by mouth as needed for migraine. May repeat in 2 hours if needed, Disp: 12 tablet, Rfl: 6  Observations/Objective: Patient is well-developed, well-nourished in no acute distress.  Resting comfortably at home.  Head is normocephalic, atraumatic.  No labored breathing. Speech is clear and coherent with logical content.  Patient is alert and oriented at baseline.   Assessment and Plan: 1. Acute swimmer's ear of left side (Primary) - ciprofloxacin-dexamethasone  (CIPRODEX) OTIC suspension; Apply 4 drops to the affected ear(s) twice daily x 7 days  Dispense: 7.5 mL; Refill: 0 - cefUROXime (CEFTIN) 500 MG tablet; Take 1 tablet (500 mg total) by mouth 2 (two) times daily with a meal for 7 days.  Dispense: 14 tablet; Refill: 0  Supportive and OTC medications reviewed. Start Ciprodex Otic. Cefuroxime oral to be started if any progressing symptoms despite topical monotherapy. Strict in-person follow-up precautions reviewed with patient. Work note declined.   Follow Up Instructions: I discussed the assessment and treatment plan with the patient. The patient was provided an opportunity to ask questions and all were answered. The patient agreed with the plan and demonstrated an understanding of the instructions.  A copy of instructions were sent to the patient via MyChart unless otherwise noted below.   The patient was advised to call back or seek an in-person evaluation if the symptoms worsen or if the condition fails to improve as anticipated.    Hyla Maillard, PA-C

## 2023-10-17 NOTE — Patient Instructions (Signed)
 Nyla Bell, thank you for joining Hyla Maillard, PA-C for today's virtual visit.  While this provider is not your primary care provider (PCP), if your PCP is located in our provider database this encounter information will be shared with them immediately following your visit.   A [facility] MyChart account gives you access to today's visit and all your visits, tests, and labs performed at [facility]  click here if you don't have a [facility] MyChart account or go to [email or web address]  Consent: (Patient) Nyla Bell provided verbal consent for this virtual visit at the beginning of the encounter.  Current Medications:  Current Outpatient Medications:    acetaminophen  (TYLENOL ) 500 MG tablet, Take 1,000 mg by mouth every 6 (six) hours as needed for headache., Disp: , Rfl:    Aspirin-Acetaminophen -Caffeine (GOODY HEADACHE PO), Take 2 packets by mouth 2 (two) times daily as needed (MIGRAINES)., Disp: , Rfl:    Erenumab -aooe (AIMOVIG ) 140 MG/ML SOAJ, Inject 140 mg into the skin every 30 (thirty) days., Disp: 1.12 mL, Rfl: 11   fluticasone  (FLONASE ) 50 MCG/ACT nasal spray, Place 2 sprays into both nostrils daily for 14 days., Disp: 11.1 mL, Rfl: 0   ibuprofen  (ADVIL ,MOTRIN ) 200 MG tablet, Take 800 mg by mouth every 6 (six) hours as needed for headache., Disp: , Rfl:    promethazine  (PHENERGAN ) 12.5 MG suppository, Place 1 suppository (12.5 mg total) rectally every 6 (six) hours as needed for nausea or vomiting., Disp: 12 each, Rfl: 0   Rimegepant Sulfate (NURTEC) 75 MG TBDP, Take 75 mg by mouth daily., Disp: 8 tablet, Rfl: 11   rizatriptan  (MAXALT ) 10 MG tablet, Take 1 tablet (10 mg total) by mouth as needed for migraine. May repeat in 2 hours if needed, Disp: 12 tablet, Rfl: 6   Medications ordered in this encounter:  No orders of the defined types were placed in this encounter.    *If you need refills on other medications prior to your next appointment,  please contact your pharmacy*  Follow-Up: Call back or seek an in-person evaluation if the symptoms worsen or if the condition fails to improve as anticipated.  [facility] [phone]  Other Instructions Otitis Externa  Otitis externa is an infection of the outer ear canal. The outer ear canal is the area between the outside of the ear and the eardrum. Otitis externa is sometimes called swimmer's ear. What are the causes? Common causes of this condition include: Swimming in dirty water. Moisture in the ear. An injury to the inside of the ear. An object stuck in the ear. A cut or scrape on the outside of the ear or in the ear canal. What increases the risk? You are more likely to get this condition if you go swimming often. What are the signs or symptoms? Itching in the ear. This is often the first symptom. Swelling of the ear. Redness in the ear. Ear pain. The pain may get worse when you pull on your ear. Pus coming from the ear. How is this treated? This condition may be treated with: Antibiotic ear drops. These are often given for 10-14 days. Medicines to reduce itching and swelling. Follow these instructions at home: If you were prescribed antibiotic ear drops, use them as told by your doctor. Do not stop using them even if you start to feel better. Take over-the-counter and prescription medicines only as told by your doctor. Avoid getting water in your ears as told by your doctor. You may  be told to avoid swimming or water sports for a few days. Keep all follow-up visits. How is this prevented? Keep your ears dry. Use the corner of a towel to dry your ears after you swim or bathe. Try not to scratch or put things in your ear. Doing these things makes it easier for germs to grow in your ear. Avoid swimming in lakes, dirty water, or swimming pools that may not have the right amount of a chemical called chlorine. Contact a doctor if: You have a fever. Your  ear is still red, swollen, or painful after 3 days. You still have pus coming from your ear after 3 days. Your redness, swelling, or pain gets worse. You have a very bad headache. Get help right away if: You have redness, swelling, and pain or tenderness behind your ear. Summary Otitis externa is an infection of the outer ear canal. Symptoms include pain, redness, and swelling of the ear. If you were prescribed antibiotic ear drops, use them as told by your doctor. Do not stop using them even if you start to feel better. Try not to scratch or put things in your ear. This information is not intended to replace advice given to you by your health care provider. Make sure you discuss any questions you have with your health care provider. Document Revised: 06/30/2020 Document Reviewed: 06/30/2020 Elsevier Patient Education  2024 [employer]   If you have been instructed to have an in-person evaluation today at a local Urgent Care facility, please use the link below. It will take you to a list of all of our available [facility], including address, phone number and hours of operation. Please do not delay care.  [facility]  If you or a family member do not have a primary care provider, use the link below to schedule a visit and establish care. When you choose a [facility] primary care physician or advanced practice provider, you gain a long-term partner in health. Find a Primary Care Provider  Learn more about [facility]'s in-office and virtual care options: [facility] - Get Care Now
